# Patient Record
Sex: Male | Born: 1990 | Race: White | Hispanic: No | Marital: Single | Smoking: Current every day smoker
Health system: Southern US, Community
[De-identification: ages and names within clinical notes are randomized; demographics above are authoritative.]

## PROBLEM LIST (undated history)

## (undated) DIAGNOSIS — Z720 Tobacco use: Secondary | ICD-10-CM

## (undated) DIAGNOSIS — I309 Acute pericarditis, unspecified: Secondary | ICD-10-CM

## (undated) HISTORY — PX: OTHER SURGICAL HISTORY: SHX169

## (undated) HISTORY — PX: TONSILLECTOMY: SUR1361

## (undated) HISTORY — PX: EXTERNAL EAR SURGERY: SHX627

---
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
## 2010-09-19 NOTE — Letter (Signed)
Summary: [facility] Referral No Show  [facility]  [address]  [place], [place] [address]   Phone: [phone]  Fax: [phone]     12/16/09  [facility] Dr. Karleen Hampshire [address] [place], [place]  [address] Ph [phone]/MWU [phone]   Re:    Derrick Woods Clinkenbeard DOB:    [birth year]    The above named patient was a no show for the scheduled appointment:  Date:  12/12/09 Time: 3:00pm  Thank you for your kind referral.  Please feel free to contact our office if we can be of further service.  Sincerely,   [facility] Office of Dr. Terrance Mass, MD

## 2010-10-10 IMAGING — CR DG FINGER MIDDLE 2+V*R*
1 series · 1 of 1 positions shown · non-contrast
Comparison: 05/28/2009.

CLINICAL DATA: Slammed finger in door 4 days ago.

RIGHT MIDDLE FINGER 2+V

[view not recorded]
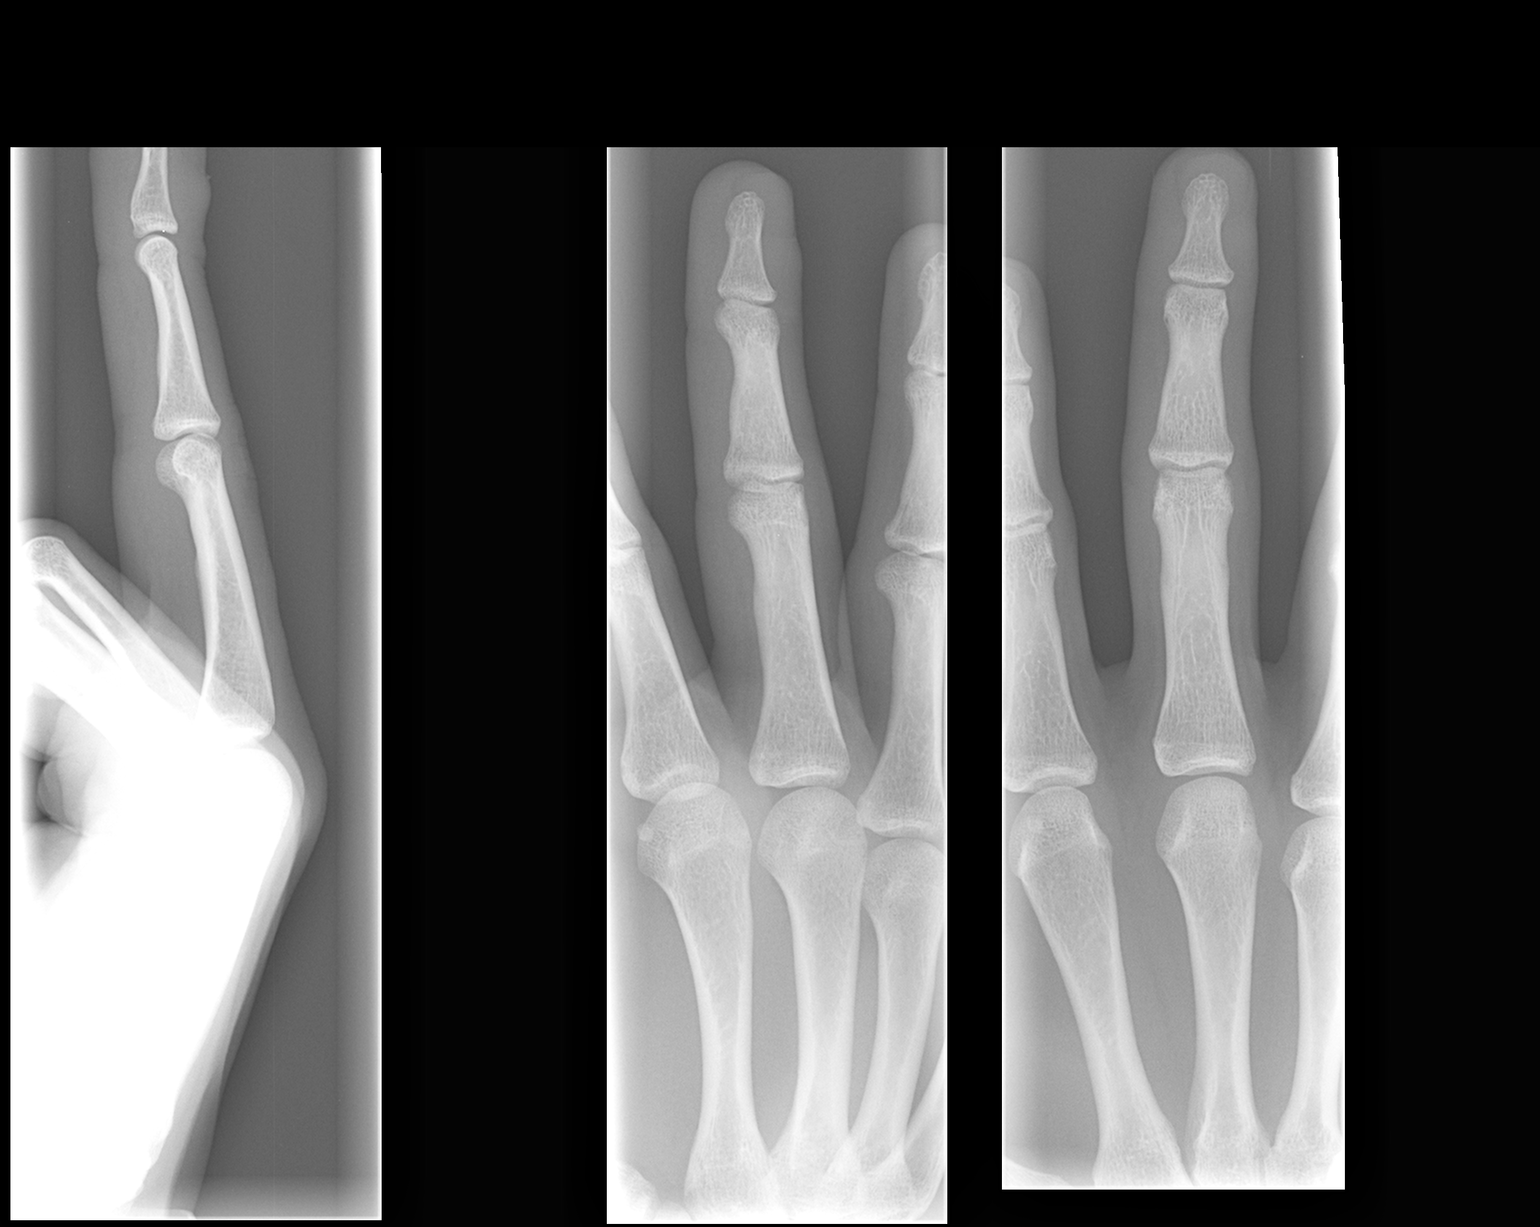

[1 of 1 positions shown; findings below may reference images not displayed]

FINDINGS: There are no fractures or dislocations.  There are no
radiopaque foreign bodies.
IMPRESSION: No acute bony injury.

## 2010-10-11 IMAGING — CR DG SHOULDER 2+V*L*
3 series · 3 of 3 positions shown · non-contrast
Comparison: None.

CLINICAL DATA: Left upper arm lacerations.  Now with delayed
swelling.

LEFT SHOULDER - 2+ VIEW

[view not recorded (1 of 3)]
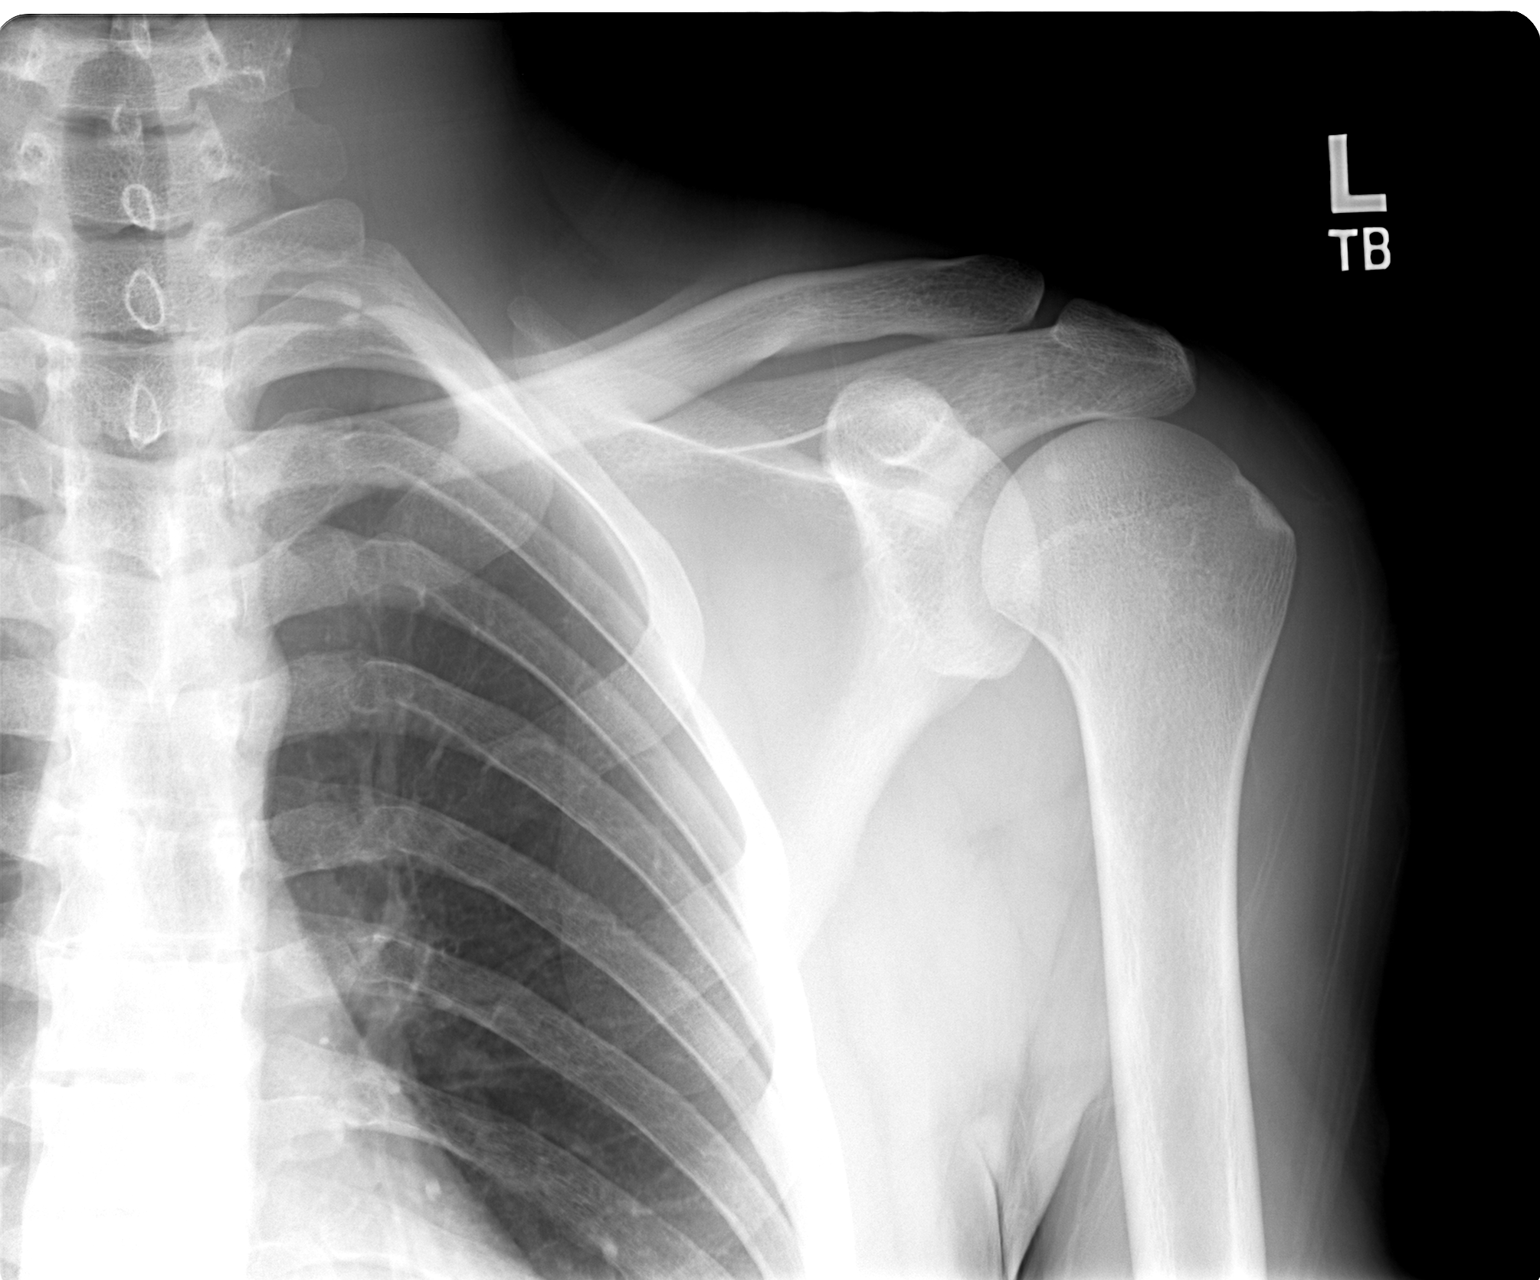

[view not recorded (2 of 3)]
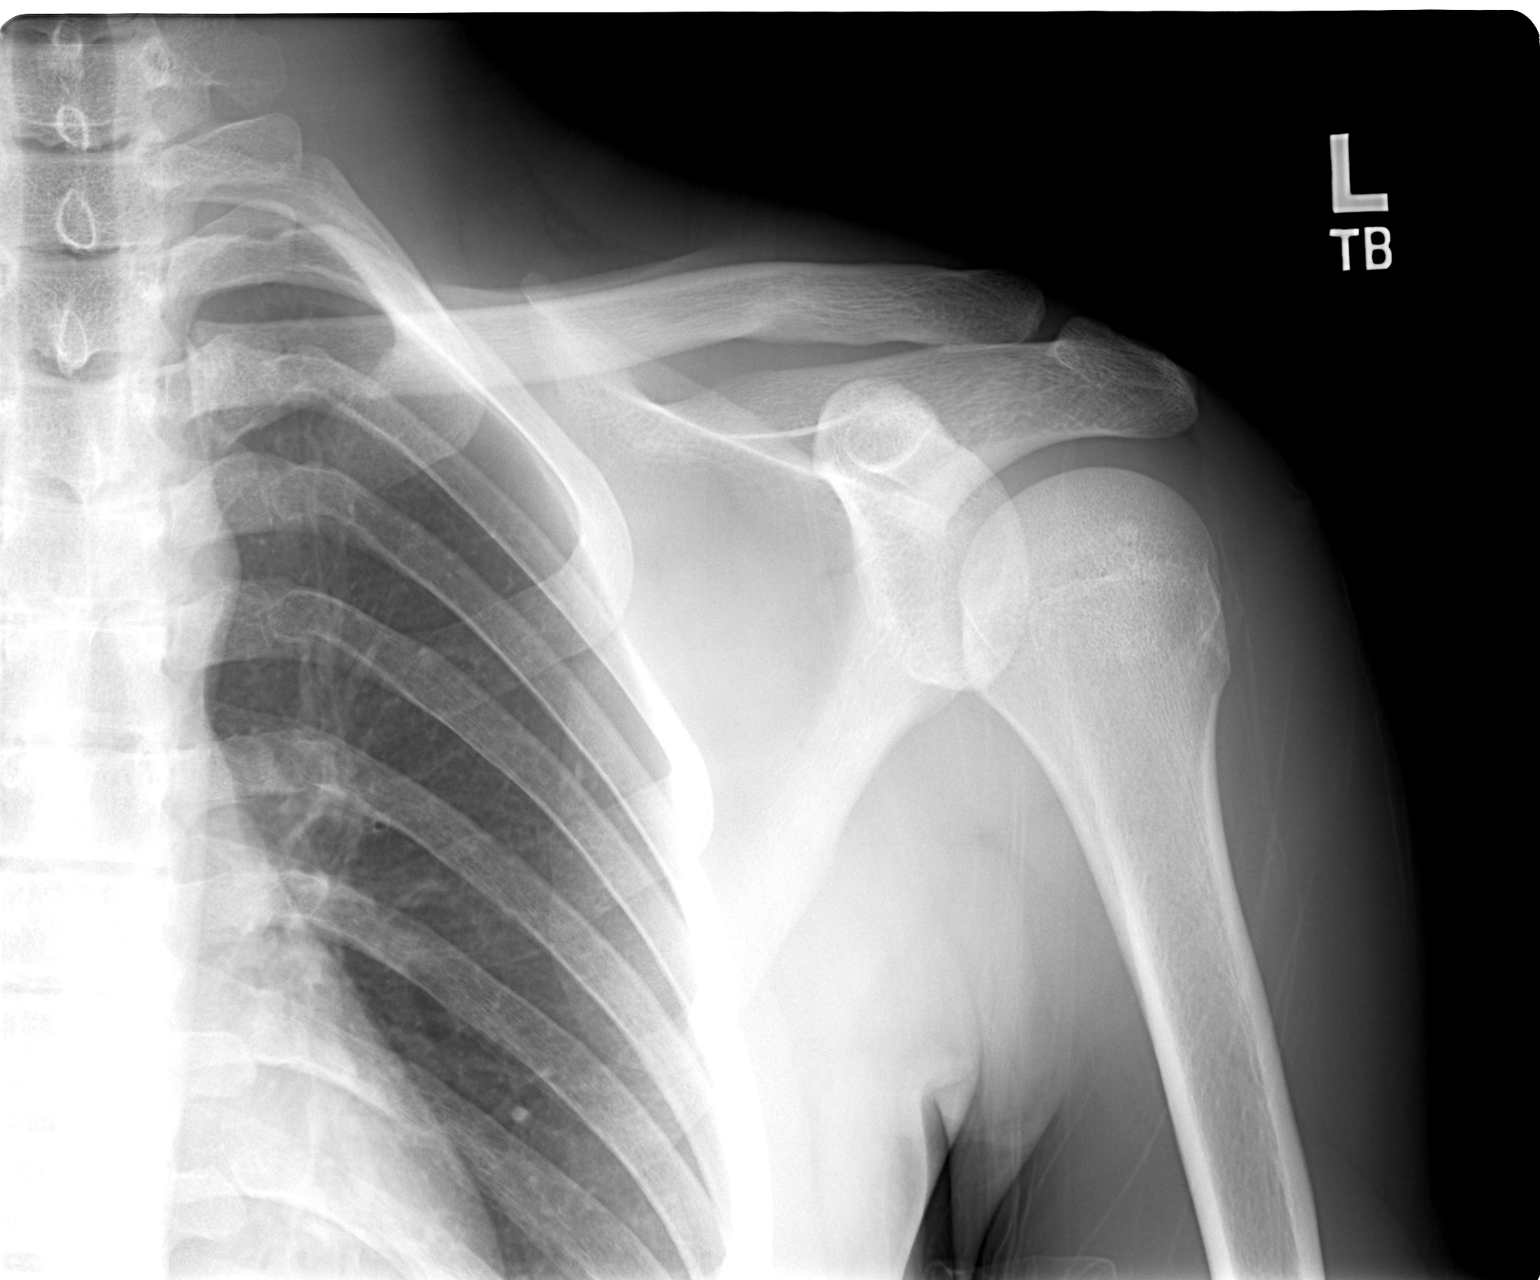

[view not recorded (3 of 3)]
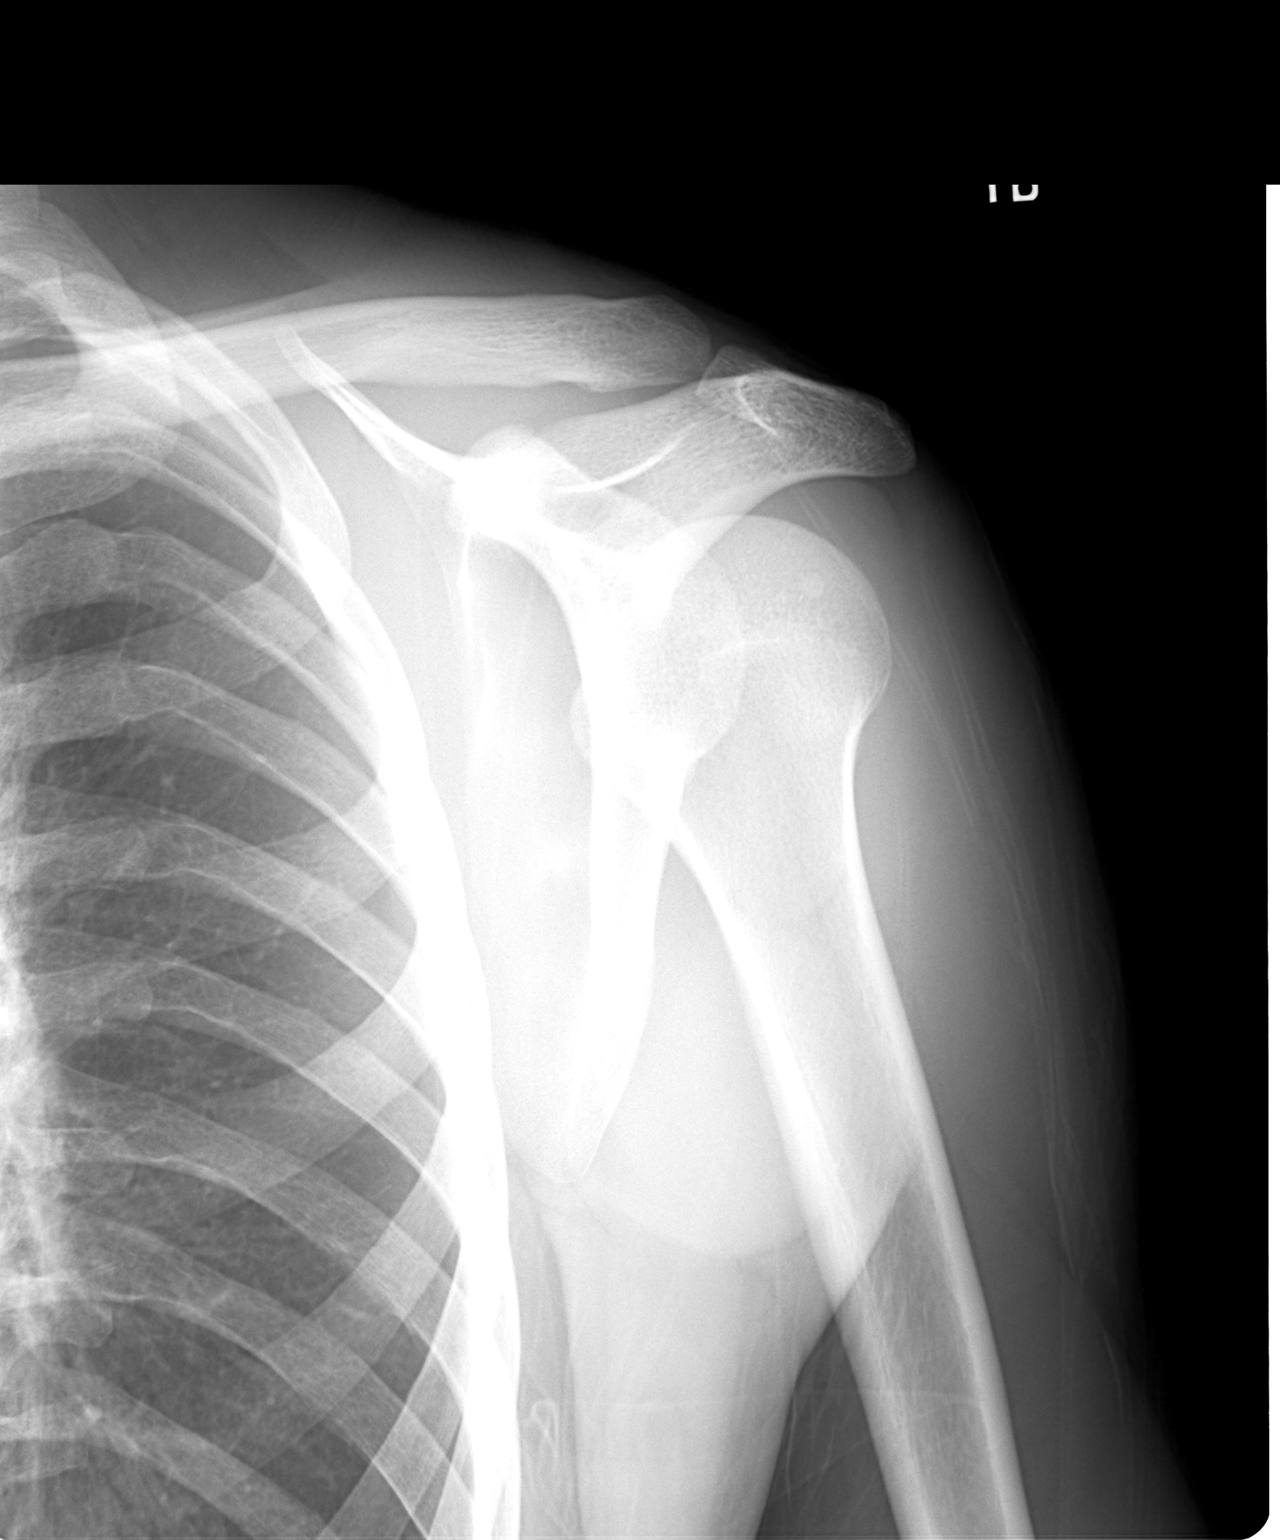

[3 of 3 positions shown; findings below may reference images not displayed]

FINDINGS: There is no evidence of fracture or dislocation.  There
is no evidence of arthropathy or other focal bone abnormality.
Soft tissues are unremarkable.
IMPRESSION: Negative.

## 2010-11-05 LAB — BASIC METABOLIC PANEL WITH GFR
CO2: 30 meq/L (ref 19–32)
Calcium: 9.2 mg/dL (ref 8.4–10.5)
Chloride: 104 meq/L (ref 96–112)
Creatinine, Ser: 1.11 mg/dL (ref 0.4–1.5)
GFR calc Af Amer: >60 mL/min (ref >60)
GFR calc non Af Amer: >60 mL/min (ref >60)
Glucose, Bld: 103 mg/dL — ABNORMAL HIGH (ref 70–99)
Sodium: 140 meq/L (ref 135–145)

## 2010-11-05 LAB — DIFFERENTIAL
Basophils Absolute: 0 10*3/uL (ref 0.0–0.1)
Basophils Relative: 0 % (ref 0–1)
Eosinophils Absolute: 0 10*3/uL (ref 0.0–0.7)
Eosinophils Relative: 0 % (ref 0–5)
Lymphocytes Relative: 15 % (ref 12–46)
Lymphs Abs: 1.3 K/uL (ref 0.7–4.0)
Monocytes Absolute: 0.8 10*3/uL (ref 0.1–1.0)
Monocytes Relative: 9 % (ref 3–12)
Neutro Abs: 6.5 10*3/uL (ref 1.7–7.7)
Neutrophils Relative %: 75 % (ref 43–77)

## 2010-11-05 LAB — CBC
HCT: 39 % (ref 39.0–52.0)
Hemoglobin: 13.3 g/dL (ref 13.0–17.0)
MCHC: 34 g/dL (ref 30.0–36.0)
MCV: 89.5 fL (ref 78.0–100.0)
Platelets: 218 K/uL (ref 150–400)
RBC: 4.35 MIL/uL (ref 4.22–5.81)
RDW: 12.6 % (ref 11.5–15.5)
WBC: 8.6 K/uL (ref 4.0–10.5)

## 2010-11-05 LAB — BASIC METABOLIC PANEL
BUN: 16 mg/dL (ref 6–23)
Potassium: 3.5 mEq/L (ref 3.5–5.1)

## 2010-11-27 LAB — BASIC METABOLIC PANEL
Calcium: 9.4 mg/dL (ref 8.4–10.5)
Chloride: 103 mEq/L (ref 96–112)
Creatinine, Ser: 1.09 mg/dL (ref 0.4–1.5)
Glucose, Bld: 108 mg/dL — ABNORMAL HIGH (ref 70–99)
Potassium: 4.1 mEq/L (ref 3.5–5.1)

## 2011-01-05 NOTE — Op Note (Signed)
[place]  Patient:    Derrick Woods, Derrick Woods                        MRN: [id number] Proc. Date: 11/07/00 Attending:  Margit Banda. Jearld Fenton, M.D. CC:         Dr. Sudie Bailey, Sidney Ace   Operative Report  PREOPERATIVE DIAGNOSIS:  Chronic serous otitis media.  POSTOPERATIVE DIAGNOSIS:  Chronic serous otitis media.  OPERATION PERFORMED:  Removal of right tympanostomy tube with another myringotomy tube and a left myringotomy tube.  SURGEON:  Margit Banda. Jearld Fenton, M.D.  ANESTHESIA:  General mask ventilation.  ESTIMATED BLOOD LOSS:  Less than 1 cc.  INDICATIONS FOR PROCEDURE:  The patient is a 20-year-old who has had previous tympanostomy tubes.  The left one has extruded and he has recollected a middle ear effusion.  He has already has his adenoids removed.  He has decreased hearing on audiogram.  The parents were informed ofthe risks and benefits of the procedure including bleeding, infection, perforation, chronic drainage, hearing loss and risks of the anesthetic.  All questions were answered and consent was obtained.  DESCRIPTION OF PROCEDURE:  The patient was taken to the operating room and placed in supine position.  After adequate general mask ventilation anesthesia, he was placed in the left gaze position.  The right tympanostomy tube was removed.  There was granulation tissue around the tympanic membrane perforation that was removed with the suction.  A Sheehy tube was placed without difficulty.  Floxin drops were instilled.  The left ear was repeated in a similar fashion except a myringotomy made in the anterior inferior quadrant.  Serous effusion was suctioned from the middle ear. Sheehy tube placed.  Floxin drops were instilled.  The patient was awakened and brought to the recovery room in stable condition.  Counts correct.. DD:  11/07/00 TD:  11/07/00 Job: [id number]

## 2011-01-05 NOTE — Op Note (Signed)
NAME:  Derrick Woods, Derrick Woods                         ACCOUNT NO.:  [id number]   MEDICAL RECORD NO.:  [id number]                   PATIENT TYPE:  AMB   LOCATION:  DSC                                  FACILITY:  [facility]   PHYSICIAN:  Suzanna Obey, M.D.                    DATE OF BIRTH:  09-24-1990   DATE OF PROCEDURE:  06/17/2003  DATE OF DISCHARGE:                                 OPERATIVE REPORT   PREOPERATIVE DIAGNOSES:  1. Eustachian tube dysfunction.  2. Left middle ear and mastoid cholesteatoma.   PROCEDURES:  1. Right T-tube placement.  2. Left tympanomastoidectomy with canal wall up with facial nerve monitor.   ANESTHESIA:  General endotracheal.   ESTIMATED BLOOD LOSS:  Approximately less than 25 mL.   INDICATIONS FOR PROCEDURE:  This is a 20 year old who has had drainage from  his left ear which has persisted and he has epithelial debris in the attic  location.  He has had previous tympanostomy tubes.  The right one is  occluded with a middle effusion.  There is also retraction.  There is severe  retraction in the attic on the left side.  Cholesteatoma was seen on CT scan  but the ossicular chain still looked intact.  The mother was informed of  risks and benefits of the procedure including bleeding, infection, facial  nerve paralysis, hearing loss, vertigo, recurrence of the cholesteatoma,  taste alteration, scar, and risk of the anesthetic.  All questions were  answered and consent was obtained.   DESCRIPTION OF PROCEDURE:  The patient was taken to the operating room,  placed in the supine position.  After adequate general endotracheal tube  anesthesia, was placed in the left gaze position and cerumen was cleaned  from the external auditory canal under otomicroscope direction.  The  tympanostomy tube which was occluded was removed which was a Sheehy tube.  There was a fairly large perforation.  The T-tube was then placed without  difficulty.  There was some thick mucous  material in the middle ear.  There  was retraction in the posterior/superior quadrant up against the  incudostapedial joint.   The left ear was then begun.  The canal and posterior auricular area was  injected with 1% lidocaine with 1:100,000 epinephrine.  The facial nerve  monitor probes were placed and the machine was calibrated with low  impedance.  The patient was prepped and draped in the usual sterile manner.  Examination of the ear under otomicroscope direction revealed significant  retraction in the attic with a deep retraction pocket.  It appeared to go on  the medial aspect of the malleus head.  A tympanomeatal flap was created  with a 6 and 12 o'clock incision and a beaver blade flap and the middle ear  was entered.  There was thick mucus in the middle ear.  There was thick  mucosa across the  incudostapedial joint which was teased off.  The  incudostapedial joint was intact.  There was obvious cholesteatoma on top of  the incus.  There was erosion of the posterior superior canal wall.  The  postauricular incision was made and the ear was elevated anteriorly.   A fascial graft was harvested from the temporalis muscle fascia.  Macewan's  triangle was well defined.  The mastoid on the CT scan had shown almost no  pneumatization of the mastoid.  A mastoidectomy was begun with carrying it  directly over Macewan's triangle.  Care was taken to dissect right along the  tegmen and thin in the canal wall.  The antrum was identified and  immediately a large cholesteatoma was encountered.  The attic area was  opened and the canal wall was thinned and the tegmen was followed.  The  cholesteatoma was teased out around the fosses incudis and off the lateral  semicircular canal.  All the bone was intact.  Once dissection was carried  to the incus it could clearly be seen that the cholesteatoma went medial to  the incus.  Also on examination, the cholesteatoma and epithelial debris was  medial  to the malleus head as well.  Because of this, the incudostapedial  joint was divided and the incus was removed.  The head of the malleus was  nipped with the malleus nippers.  The cholesteatoma was then teased out of  the attic region carefully making sure to get all epithelial debris.  Everything was irrigated.   The whirlybird was used to make sure the underneath of the bridge was  removed of all epithelial debris.  The stapes was intact and did not appear  to have any erosion.  The facial nerve was not identified as it was deep and  did appear to have a bony covering over it and the facial nerve monitor  never discharged.  Because of the large attic retraction eroded area, some  Gelfoam soaked with Ciprodex was placed into this area and then a piece of  cartilage was harvested from the postauricular aspect of the ear and  positioned into the attic region.  The fascial graft was then placed over  this and tucked underneath the tympanic membrane and brought up onto the  canal wall.  The tube that was in the ear was removed and the T-tube was  placed.  The tympanomeatal flap was then laid back down over the canal wall  and positioned.  The mastoid was partially filled with Ciprodex Gelfoam.  This was very sclerotic, also no mastoid cells, except for the antrum.  The  lateral portion of the tympanic membrane was filled with Gelfoam soaked with  Ciprodex.  The tube was left open so it could drain.   The postauricular incision was closed  closing the periosteum of the mastoid  flap with interrupted 4-0 chromic and subcutaneous with interrupted 4-0  chromic.  The skin was closed with Benzoin and Steri-Strips.  A mastoid  dressing was placed.   The patient was awakened and brought to the recovery room in stable  condition.  Counts correct.                                               Suzanna Obey, M.D.   Cordelia Pen  D:  06/17/2003  T:  06/17/2003  Job:  [id number]  cc:   Patrica Duel,  M.D.  [address]  [place]  [place] [address]  Fax: [phone]

## 2011-12-31 ENCOUNTER — Emergency Department (HOSPITAL_COMMUNITY)
Admission: EM | Admit: 2011-12-31 | Discharge: 2012-01-01 | Disposition: A | Discharge status: Home / Self Care | Payer: Self-pay | Attending: Emergency Medicine | Admitting: Emergency Medicine

## 2011-12-31 ENCOUNTER — Encounter (HOSPITAL_COMMUNITY): Payer: Self-pay

## 2011-12-31 DIAGNOSIS — H5710 Ocular pain, unspecified eye: Secondary | ICD-10-CM | POA: Insufficient documentation

## 2011-12-31 DIAGNOSIS — H2000 Unspecified acute and subacute iridocyclitis: Secondary | ICD-10-CM | POA: Insufficient documentation

## 2011-12-31 NOTE — ED Notes (Signed)
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 Having burning in both eyes per pt. Watched them for about 2 hours off and on per pt.

## 2012-01-01 MED ORDER — HOMATROPINE HBR 5 % OP SOLN
2.0000 [drp] | Freq: Once | OPHTHALMIC | Status: DC
  Filled 2012-01-01: qty 5

## 2012-01-01 MED ORDER — HYDROCODONE-ACETAMINOPHEN 5-325 MG PO TABS
1.0000 | ORAL_TABLET | Freq: Once | ORAL | Status: AC
  Administered 2012-01-01: 1 via ORAL
  Filled 2012-01-01: qty 1

## 2012-01-01 MED ORDER — HYDROCODONE-ACETAMINOPHEN 5-325 MG PO TABS: ORAL_TABLET | ORAL | Status: DC

## 2012-01-01 MED ORDER — TROPICAMIDE 1 % OP SOLN
2.0000 [drp] | Freq: Once | OPHTHALMIC | Status: AC
  Administered 2012-01-01: 2 [drp] via OPHTHALMIC
  Filled 2012-01-01: qty 3

## 2012-01-01 MED ORDER — TROPICAMIDE 1 % OP SOLN: OPHTHALMIC | Status: DC

## 2012-01-01 MED ORDER — IBUPROFEN 800 MG PO TABS
800.0000 mg | ORAL_TABLET | Freq: Once | ORAL | Status: AC
  Administered 2012-01-01: 800 mg via ORAL
  Filled 2012-01-01: qty 1

## 2012-01-01 NOTE — Discharge Instructions (Signed)
Iritis Iritis is an inflammation of the colored part of the eye (iris). Other parts at the front of the eye may also be inflamed. The iris is part of the middle layer of the eyeball which is called the uvea or the uveal track. Any part of the uveal track can become inflamed. The other portions of the uveal track are the choroid (the thin membrane under the outer layer of the eye), and the ciliary body (joins the choroid and the iris and produces the fluid in the front of the eye).  It is extremely important to treat iritis early, as it may lead to internal eye damage causing scarring or diseases such as glaucoma. Some people have only one attack of iritis (in one or both eyes) in their lifetime, while others may get it many times. CAUSES Iritis can be associated with many different diseases, but mostly occurs in otherwise healthy people. Examples of diseases that can be associated with iritis include:  Diseases where the body's immune system attacks tissues within your own body (autoimmune diseases).   Infections (tuberculosis, gonorrhea, fungus infections, Lyme disease, infection of the lining of the heart).   Trauma or injury.   Eye diseases (acute glaucoma and others).   Inflammation from other parts of the uveal track.   Severe eye infections.   Other rare diseases.  SYMPTOMS  Eye pain or aching.   Sensitivity to light.   Loss of sight or blurred vision.   Redness of the eye. This is often accompanied by a ring of redness around the outside of the cornea, or clear covering at the front of the eye (ciliary flush).   Excessive tearing of the eye(s).   A small pupil that does not enlarge in the dark and stays smaller than the other eye's pupil.   A whitish area that obscures the lower part of the colored circular iris. Sometimes this is visible when looking at the eye, where the whitish area has a "fluid level" or flat top. This is called a "hypopyon" and is actually pus inside the  eye.  Since iritis causes the eye to become red, it is often confused with a much less dangerous form of "pink eye" or conjunctivitis. One of the most important symptoms is sensitivity to light. Anytime there is redness, discomfort in the eye(s) and extreme light sensitivity, it is extremely important to see an ophthalmologist as soon as possible. TREATMENT Acute iritis requires prompt medical evaluation by an eye specialist (ophthalmologist.) Treatment depends on the underlying cause but may include:  Corticosteroid eye drops and dilating eye drops. Follow your caregiver's exact instructions on taking and stopping corticosteroid medications (drops or pills).   Occasionally, the iritis will be so severe that it will not respond to commonly used medications. If this happens, it may be necessary to use steroid injections. The injections are given under the eye's outer surface. Sometimes oral medications are given. The decision on treatment used for iritis is usually made on an individual basis.  HOME CARE INSTRUCTIONS Your care giver will give specific instructions regarding the use of eye medications or other medications. Be certain to follow all instructions in both taking and stopping the medications. SEEK IMMEDIATE MEDICAL CARE IF:  You have redness of one or both eye.   You experience a great deal of light sensitivity.   You have pain or aching in either eye.  MAKE SURE YOU:   Understand these instructions.   Will watch your condition.  Will get help right away if you are not doing well or get worse.  Document Released: 08/06/2005 Document Revised: 07/26/2011 Document Reviewed: 01/24/2007 [facility] Patient Information 2012 [place], [place].   Take the pain medicine as directed.  Take ibuprofen 800 mg every 8 hrs with food.  Insert 2 drops of homatropine in both eyes every8 hrs for the next 1-2 days.  Follow up with dr. Lita Mains.

## 2012-01-01 NOTE — ED Provider Notes (Signed)
History     CSN: [id number]  Arrival date & time 12/31/11  2347   First MD Initiated Contact with Patient 01/01/12 [phone]      Chief Complaint  Patient presents with  . 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  States he was not looking directly at the light but was looking in the general direction.  B eyes now hurting.  Patient is a 21 y.o. male presenting with eye injury. The history is provided by the patient. No language interpreter was used.  Eye Injury This is a new problem. The current episode started today. Pertinent negatives include no headaches.    History reviewed. No pertinent past medical history.  History reviewed. No pertinent past surgical history.  History reviewed. No pertinent family history.  History  Substance Use Topics  . Smoking status: Never Smoker   . Smokeless tobacco: Not on file  . Alcohol Use: No      Review of Systems  Eyes: Positive for pain. Negative for discharge, redness and visual disturbance.  Neurological: Negative for headaches.  All other systems reviewed and are negative.    Allergies  Review of patient's allergies indicates no known allergies.  Home Medications   Current Outpatient Rx  Name Route Sig Dispense Refill  . HYDROCODONE-ACETAMINOPHEN 5-325 MG PO TABS  One tab po q 4-6 hrs prn pain 20 tablet 0    BP 107/67  Pulse 81  Temp(Src) 98 F (36.7 C) (Oral)  Resp 20  Ht 5\' 10"  (1.778 m)  Wt 150 lb (68.04 kg)  BMI 21.52 kg/m2  SpO2 100%  Physical Exam  Nursing note and vitals reviewed. Constitutional: He is oriented to person, place, and time. He appears well-developed and well-nourished.  HENT:  Head: Normocephalic and atraumatic.  Eyes: Conjunctivae, EOM and lids are normal. Pupils are equal, round, and reactive to light. Right eye exhibits no discharge and no exudate.  No foreign body present in the right eye. Left eye exhibits no discharge and no exudate. No foreign body present in the left eye. Right conjunctiva is not injected. Right conjunctiva has no hemorrhage. Left conjunctiva is not injected. Left conjunctiva has no hemorrhage. No scleral icterus.       B eyes very light sensitive.  Neck: Normal range of motion.  Cardiovascular: Normal rate, regular rhythm, normal heart sounds and intact distal pulses.   Pulmonary/Chest: Effort normal and breath sounds normal. No respiratory distress.  Abdominal: Soft. He exhibits no distension. There is no tenderness.  Musculoskeletal: Normal range of motion.  Neurological: He is alert and oriented to person, place, and time.  Skin: Skin is warm and dry.  Psychiatric: He has a normal mood and affect. Judgment normal.    ED Course  Procedures (including critical care time)  Labs Reviewed - No data to display No results found.   1. Acute iritis of both eyes       MDM  Avoid bright light for 1-2 day rx homatropine drops 2 drops every 8 hrs x 2 days. rx hydrocodone , 20 OTC ibuprofen 800 mg TID F/u with dr. Janit Bern, PA 01/01/12 [phone]

## 2012-01-02 NOTE — ED Provider Notes (Signed)
Medical screening examination/treatment/procedure(s) were performed by non-physician practitioner and as supervising physician I was immediately available for consultation/collaboration.  Wyoma Genson S. Rebeccah Ivins, MD 01/02/12 0727 

## 2013-03-27 ENCOUNTER — Emergency Department (HOSPITAL_COMMUNITY)
Admission: EM | Admit: 2013-03-27 | Discharge: 2013-03-27 | Disposition: A | Discharge status: Home / Self Care | Payer: Self-pay | Attending: Emergency Medicine | Admitting: Emergency Medicine

## 2013-03-27 ENCOUNTER — Encounter (HOSPITAL_COMMUNITY): Payer: Self-pay | Admitting: *Deleted

## 2013-03-27 DIAGNOSIS — R079 Chest pain, unspecified: Secondary | ICD-10-CM | POA: Insufficient documentation

## 2013-03-27 DIAGNOSIS — J029 Acute pharyngitis, unspecified: Secondary | ICD-10-CM | POA: Insufficient documentation

## 2013-03-27 DIAGNOSIS — R059 Cough, unspecified: Secondary | ICD-10-CM | POA: Insufficient documentation

## 2013-03-27 DIAGNOSIS — F17200 Nicotine dependence, unspecified, uncomplicated: Secondary | ICD-10-CM | POA: Insufficient documentation

## 2013-03-27 DIAGNOSIS — J3489 Other specified disorders of nose and nasal sinuses: Secondary | ICD-10-CM | POA: Insufficient documentation

## 2013-03-27 DIAGNOSIS — R6883 Chills (without fever): Secondary | ICD-10-CM | POA: Insufficient documentation

## 2013-03-27 DIAGNOSIS — J069 Acute upper respiratory infection, unspecified: Secondary | ICD-10-CM | POA: Insufficient documentation

## 2013-03-27 DIAGNOSIS — R05 Cough: Secondary | ICD-10-CM | POA: Insufficient documentation

## 2013-03-27 MED ORDER — BENZONATATE 100 MG PO CAPS: 200.0000 mg | ORAL_CAPSULE | Freq: Three times a day (TID) | ORAL | Status: DC | PRN

## 2013-03-27 MED ORDER — PREDNISONE 10 MG PO TABS: ORAL_TABLET | ORAL | Status: DC

## 2013-03-27 NOTE — ED Notes (Signed)
Sore throat ,cough,chest hurts.for 2 weeks.

## 2013-03-27 NOTE — ED Provider Notes (Signed)
CSN: [id number]     Arrival date & time 03/27/13  1759 History     First MD Initiated Contact with Patient 03/27/13 1818     Chief Complaint  Patient presents with  . Sore Throat   (Consider location/radiation/quality/duration/timing/severity/associated sxs/prior Treatment) Patient is a 22 y.o. male presenting with pharyngitis. The history is provided by the patient.  Sore Throat This is a new problem. The current episode started 1 to 4 weeks ago. The problem occurs constantly. The problem has been unchanged. Associated symptoms include chest pain, chills, congestion, coughing and a sore throat. Pertinent negatives include no abdominal pain, anorexia, fever, headaches, joint swelling, myalgias, nausea, neck pain, numbness, rash, swollen glands, urinary symptoms, visual change, vomiting or weakness. Associated symptoms comments:  C/o sharp pain to left upper chest with movement.  Lifts heavy objects at work.  No wheezing or shortness of breath. Nothing aggravates the symptoms. He has tried nothing for the symptoms. The treatment provided no relief.    History reviewed. No pertinent past medical history. Past Surgical History  Procedure Laterality Date  . External ear surgery    . Tonsillectomy    . Tubes in ears     History reviewed. No pertinent family history. History  Substance Use Topics  . Smoking status: Current Every Day Smoker  . Smokeless tobacco: Not on file  . Alcohol Use: No    Review of Systems  Constitutional: Positive for chills. Negative for fever, activity change and appetite change.  HENT: Positive for congestion and sore throat. Negative for ear pain, mouth sores, trouble swallowing, neck pain, neck stiffness and voice change.   Respiratory: Positive for cough. Negative for chest tightness, shortness of breath, wheezing and stridor.   Cardiovascular: Positive for chest pain.  Gastrointestinal: Negative for nausea, vomiting, abdominal pain and anorexia.    Genitourinary: Negative for hematuria and flank pain.  Musculoskeletal: Negative for myalgias and joint swelling.  Skin: Negative for rash.  Neurological: Negative for dizziness, syncope, weakness, numbness and headaches.  Hematological: Negative for adenopathy.  All other systems reviewed and are negative.    Allergies  Review of patient's allergies indicates no known allergies.  Home Medications   Current Outpatient Rx  Name  Route  Sig  Dispense  Refill  . benzonatate (TESSALON) 100 MG capsule   Oral   Take 2 capsules (200 mg total) by mouth 3 (three) times daily as needed for cough.   21 capsule   0   . predniSONE (DELTASONE) 10 MG tablet      Take 6 tablets day one, 5 tablets day two, 4 tablets day three, 3 tablets day four, 2 tablets day five, then 1 tablet day six   21 tablet   0    BP 116/66  Pulse 91  Temp(Src) 98.2 F (36.8 C) (Oral)  Resp 20  Ht 5\' 10"  (1.778 m)  Wt 155 lb (70.308 kg)  BMI 22.24 kg/m2  SpO2 99% Physical Exam  Nursing note and vitals reviewed. Constitutional: He is oriented to person, place, and time. He appears well-developed and well-nourished. No distress.  HENT:  Head: Normocephalic and atraumatic. No trismus in the jaw.  Right Ear: Tympanic membrane and ear canal normal.  Left Ear: Tympanic membrane and ear canal normal.  Nose: Rhinorrhea present. No mucosal edema.  Mouth/Throat: Uvula is midline and mucous membranes are normal. No edematous. Posterior oropharyngeal edema and posterior oropharyngeal erythema present. No oropharyngeal exudate or tonsillar abscesses.  Mild erythema of the oropharynx  and lengthening of the uvula.  No oral lesions , edema or exudates.  Airway is patent.  No PTA.    Eyes: EOM are normal. Pupils are equal, round, and reactive to light.  Neck: Normal range of motion. Neck supple.  Cardiovascular: Normal rate, regular rhythm, normal heart sounds and intact distal pulses.   No murmur heard. Pulmonary/Chest:  Effort normal and breath sounds normal. No respiratory distress. He has no wheezes. He has no rales. He exhibits tenderness. He exhibits no bony tenderness, no crepitus and no retraction.    Localized ttp of the left chest wall and anterior left shoulder.  Pain is reproduced with arm movement.  No joint pain or cervical tenderness.  Pt has full ROM of the left arm.  Distal sensation intact, radial pulse brisk, grip strength strong and equal bilaterally  Musculoskeletal: Normal range of motion. He exhibits no edema.  Lymphadenopathy:    He has no cervical adenopathy.  Neurological: He is alert and oriented to person, place, and time. He exhibits normal muscle tone. Coordination normal.  Skin: Skin is warm and dry.    ED Course   Procedures (including critical care time)  Labs Reviewed - No data to display No results found. 1. Acute pharyngitis   2. URI (upper respiratory infection)     MDM     VSS.  Pt is well appearing, PERC negative.   Chest pain likely musculoskeletal.  Has mild lengthening of the uvula w/o edema.  Airway patent.  Appears stable for discharge.  Agrees to return here for any worsening symptoms  Aneliz Carbary L. Trisha Mangle, PA-C 03/27/13 2344

## 2013-03-28 NOTE — ED Provider Notes (Signed)
History/physical exam/procedure(s) were performed by non-physician practitioner and as supervising physician I was immediately available for consultation/collaboration. I have reviewed all notes and am in agreement with care and plan.   Millee Denise S Pavneet Markwood, MD 03/28/13 2000 

## 2015-09-17 ENCOUNTER — Emergency Department (HOSPITAL_COMMUNITY)
Admission: EM | Admit: 2015-09-17 | Discharge: 2015-09-17 | Disposition: A | Discharge status: Home / Self Care | Payer: BLUE CROSS/BLUE SHIELD | Attending: Emergency Medicine | Admitting: Emergency Medicine

## 2015-09-17 ENCOUNTER — Encounter (HOSPITAL_COMMUNITY): Payer: Self-pay | Admitting: *Deleted

## 2015-09-17 DIAGNOSIS — F17210 Nicotine dependence, cigarettes, uncomplicated: Secondary | ICD-10-CM | POA: Insufficient documentation

## 2015-09-17 DIAGNOSIS — M545 Low back pain: Secondary | ICD-10-CM | POA: Insufficient documentation

## 2015-09-17 DIAGNOSIS — R52 Pain, unspecified: Secondary | ICD-10-CM | POA: Diagnosis present

## 2015-09-17 DIAGNOSIS — B349 Viral infection, unspecified: Secondary | ICD-10-CM | POA: Diagnosis not present

## 2015-09-17 DIAGNOSIS — M546 Pain in thoracic spine: Secondary | ICD-10-CM | POA: Insufficient documentation

## 2015-09-17 MED ORDER — BENZONATATE 100 MG PO CAPS: 200.0000 mg | ORAL_CAPSULE | Freq: Three times a day (TID) | ORAL | Status: DC | PRN

## 2015-09-17 MED ORDER — IBUPROFEN 800 MG PO TABS: 800.0000 mg | ORAL_TABLET | Freq: Three times a day (TID) | ORAL | Status: DC

## 2015-09-17 MED ORDER — OSELTAMIVIR PHOSPHATE 75 MG PO CAPS: 75.0000 mg | ORAL_CAPSULE | Freq: Two times a day (BID) | ORAL | Status: DC

## 2015-09-17 NOTE — ED Provider Notes (Signed)
CSN: [id number]     Arrival date & time 09/17/15  4098 History   First MD Initiated Contact with Patient 09/17/15 [phone]     Chief Complaint  Patient presents with  . Generalized Body Aches     (Consider location/radiation/quality/duration/timing/severity/associated sxs/prior Treatment) HPI  Derrick Woods is a 25 y.o. male who presents to the Emergency Department complaining of sudden onset of generalized body aches, fever, chills, cough and nasal congestion.  He states that one of his co-workers has been recently diagnosed with the flu and he believes he has the same.  He reports having aching pain to most of his body especially his upper and lower back.  He has tried Dayquil with out relief.  Symptoms began yesterday.  He denies abdominal pain, dysuria, shortness of breath, nausea vomiting and chest pain.  Nothing makes his symptoms better or worse.   History reviewed. No pertinent past medical history. Past Surgical History  Procedure Laterality Date  . External ear surgery    . Tonsillectomy    . Tubes in ears     No family history on file. Social History  Substance Use Topics  . Smoking status: Current Every Day Smoker    Types: Cigarettes  . Smokeless tobacco: None  . Alcohol Use: No    Review of Systems  Constitutional: Positive for fever, chills and fatigue. Negative for activity change and appetite change.  HENT: Positive for congestion and rhinorrhea. Negative for facial swelling, sore throat and trouble swallowing.   Eyes: Negative for visual disturbance.  Respiratory: Positive for cough. Negative for shortness of breath, wheezing and stridor.   Gastrointestinal: Negative for nausea, vomiting and abdominal pain.  Genitourinary: Negative for dysuria, flank pain and decreased urine volume.  Musculoskeletal: Positive for myalgias and back pain. Negative for neck pain and neck stiffness.  Skin: Negative.  Negative for rash.  Neurological: Negative for dizziness,  weakness, numbness and headaches.  Hematological: Negative for adenopathy.  Psychiatric/Behavioral: Negative for confusion.  All other systems reviewed and are negative.     Allergies  Review of patient's allergies indicates no known allergies.  Home Medications   Prior to Admission medications   Medication Sig Start Date End Date Taking? Authorizing Provider  benzonatate (TESSALON) 100 MG capsule Take 2 capsules (200 mg total) by mouth 3 (three) times daily as needed for cough. 03/27/13   Ailen Strauch, PA-C  predniSONE (DELTASONE) 10 MG tablet Take 6 tablets day one, 5 tablets day two, 4 tablets day three, 3 tablets day four, 2 tablets day five, then 1 tablet day six 03/27/13   Marquerite Forsman, PA-C   BP 133/76 mmHg  Pulse 92  Temp(Src) 98.9 F (37.2 C) (Oral)  Resp 16  Ht  (1.778 m)  Wt 72.576 kg  BMI 22.96 kg/m2  SpO2 97% Physical Exam  Constitutional: He is oriented to person, place, and time. He appears well-developed and well-nourished. No distress.  HENT:  Head: Normocephalic and atraumatic.  Right Ear: Tympanic membrane and ear canal normal.  Left Ear: Tympanic membrane and ear canal normal.  Nose: Mucosal edema present. No rhinorrhea.  Mouth/Throat: Uvula is midline and mucous membranes are normal. No trismus in the jaw. No uvula swelling. No oropharyngeal exudate, posterior oropharyngeal edema, posterior oropharyngeal erythema or tonsillar abscesses.  Eyes: Conjunctivae are normal.  Neck: Normal range of motion and phonation normal. Neck supple. No Brudzinski's sign and no Kernig's sign noted.  Cardiovascular: Normal rate, regular rhythm and normal heart sounds.  No murmur heard. Pulmonary/Chest: Effort normal and breath sounds normal. No respiratory distress. He has no wheezes. He has no rales.  Abdominal: Soft. He exhibits no distension. There is no tenderness. There is no rebound and no guarding.  Musculoskeletal: Normal range of motion. He exhibits no edema.   Lymphadenopathy:    He has no cervical adenopathy.  Neurological: He is alert and oriented to person, place, and time. He exhibits normal muscle tone. Coordination normal.  Skin: Skin is warm and dry.  Psychiatric: He has a normal mood and affect.  Nursing note and vitals reviewed.   ED Course  Procedures (including critical care time) Labs Review Labs Reviewed - No data to display  Imaging Review No results found. I have personally reviewed and evaluated these images and lab results as part of my medical decision-making.   EKG Interpretation None      MDM   Final diagnoses:  Viral illness    Pt is well appearing, non-toxic.  Vitals stable.  Sx's likely related to viral illness and possible influenza.  He agrees to symptomatic tx with tamiflu, ibuprofen and tessalon for cough.  Advised to PCP f/u or ER return if needed. Appears stable for d/c and agrees to plan    Pauline Aus, PA-C 09/17/15 1004  Nelva Nay, MD 09/18/15 [phone]

## 2015-09-17 NOTE — ED Notes (Signed)
Non-productive cough, upper back pain with cough, body aches and reported fever, stating chills. Symptoms began yesterday.

## 2016-08-03 ENCOUNTER — Observation Stay (HOSPITAL_COMMUNITY): Payer: BLUE CROSS/BLUE SHIELD

## 2016-08-03 ENCOUNTER — Encounter (HOSPITAL_COMMUNITY): Payer: Self-pay | Admitting: Emergency Medicine

## 2016-08-03 ENCOUNTER — Observation Stay (HOSPITAL_COMMUNITY)
Admission: EM | Admit: 2016-08-03 | Discharge: 2016-08-05 | Disposition: A | Discharge status: Home / Self Care | Payer: BLUE CROSS/BLUE SHIELD | Attending: Cardiology | Admitting: Cardiology

## 2016-08-03 ENCOUNTER — Encounter (HOSPITAL_COMMUNITY)
Admission: EM | Disposition: A | Discharge status: Home / Self Care | Payer: Self-pay | Source: Home / Self Care | Attending: Emergency Medicine

## 2016-08-03 ENCOUNTER — Emergency Department (HOSPITAL_BASED_OUTPATIENT_CLINIC_OR_DEPARTMENT_OTHER): Payer: BLUE CROSS/BLUE SHIELD

## 2016-08-03 ENCOUNTER — Emergency Department (HOSPITAL_COMMUNITY): Payer: BLUE CROSS/BLUE SHIELD

## 2016-08-03 ENCOUNTER — Ambulatory Visit (HOSPITAL_COMMUNITY): Admit: 2016-08-03 | Payer: BLUE CROSS/BLUE SHIELD | Admitting: Internal Medicine

## 2016-08-03 DIAGNOSIS — R7989 Other specified abnormal findings of blood chemistry: Secondary | ICD-10-CM | POA: Diagnosis not present

## 2016-08-03 DIAGNOSIS — Z8249 Family history of ischemic heart disease and other diseases of the circulatory system: Secondary | ICD-10-CM | POA: Insufficient documentation

## 2016-08-03 DIAGNOSIS — F17200 Nicotine dependence, unspecified, uncomplicated: Secondary | ICD-10-CM

## 2016-08-03 DIAGNOSIS — R748 Abnormal levels of other serum enzymes: Secondary | ICD-10-CM | POA: Diagnosis not present

## 2016-08-03 DIAGNOSIS — F17210 Nicotine dependence, cigarettes, uncomplicated: Secondary | ICD-10-CM | POA: Insufficient documentation

## 2016-08-03 DIAGNOSIS — Z23 Encounter for immunization: Secondary | ICD-10-CM | POA: Diagnosis not present

## 2016-08-03 DIAGNOSIS — R0789 Other chest pain: Secondary | ICD-10-CM | POA: Insufficient documentation

## 2016-08-03 DIAGNOSIS — R079 Chest pain, unspecified: Secondary | ICD-10-CM | POA: Diagnosis not present

## 2016-08-03 DIAGNOSIS — I319 Disease of pericardium, unspecified: Secondary | ICD-10-CM

## 2016-08-03 DIAGNOSIS — I309 Acute pericarditis, unspecified: Principal | ICD-10-CM | POA: Insufficient documentation

## 2016-08-03 DIAGNOSIS — F1290 Cannabis use, unspecified, uncomplicated: Secondary | ICD-10-CM | POA: Diagnosis not present

## 2016-08-03 DIAGNOSIS — I300 Acute nonspecific idiopathic pericarditis: Secondary | ICD-10-CM

## 2016-08-03 DIAGNOSIS — R778 Other specified abnormalities of plasma proteins: Secondary | ICD-10-CM

## 2016-08-03 DIAGNOSIS — I249 Acute ischemic heart disease, unspecified: Secondary | ICD-10-CM | POA: Insufficient documentation

## 2016-08-03 HISTORY — DX: Acute pericarditis, unspecified: I30.9

## 2016-08-03 HISTORY — DX: Tobacco use: Z72.0

## 2016-08-03 HISTORY — PX: CARDIAC CATHETERIZATION: SHX172

## 2016-08-03 LAB — CBC
HCT: 42.8 % (ref 39.0–52.0)
HEMATOCRIT: 41.8 % (ref 39.0–52.0)
HEMOGLOBIN: 14.5 g/dL (ref 13.0–17.0)
HEMOGLOBIN: 14.1 g/dL (ref 13.0–17.0)
MCH: 31.4 pg (ref 26.0–34.0)
MCH: 31.3 pg (ref 26.0–34.0)
MCHC: 33.9 g/dL (ref 30.0–36.0)
MCHC: 33.7 g/dL (ref 30.0–36.0)
MCV: 92.6 fL (ref 78.0–100.0)
MCV: 92.7 fL (ref 78.0–100.0)
Platelets: 224 10*3/uL (ref 150–400)
Platelets: 205 10*3/uL (ref 150–400)
RBC: 4.62 MIL/uL (ref 4.22–5.81)
RBC: 4.51 MIL/uL (ref 4.22–5.81)
RDW: 12.6 % (ref 11.5–15.5)
RDW: 12.5 % (ref 11.5–15.5)
WBC: 9.6 10*3/uL (ref 4.0–10.5)
WBC: 10.9 10*3/uL — ABNORMAL HIGH (ref 4.0–10.5)

## 2016-08-03 LAB — COMPREHENSIVE METABOLIC PANEL
ALBUMIN: 4.3 g/dL (ref 3.5–5.0)
ALT: 17 U/L (ref 17–63)
AST: 28 U/L (ref 15–41)
Alkaline Phosphatase: 55 U/L (ref 38–126)
Anion gap: 8 (ref 5–15)
BUN: 14 mg/dL (ref 6–20)
CHLORIDE: 102 mmol/L (ref 101–111)
CO2: 28 mmol/L (ref 22–32)
Calcium: 9.3 mg/dL (ref 8.9–10.3)
Creatinine, Ser: 0.99 mg/dL (ref 0.61–1.24)
GFR calc Af Amer: >60 mL/min (ref >60)
GFR calc non Af Amer: >60 mL/min (ref >60)
GLUCOSE: 129 mg/dL — AB (ref 65–99)
POTASSIUM: 3.1 mmol/L — AB (ref 3.5–5.1)
Sodium: 138 mmol/L (ref 135–145)
Total Bilirubin: 0.4 mg/dL (ref 0.3–1.2)
Total Protein: 7.8 g/dL (ref 6.5–8.1)

## 2016-08-03 LAB — MRSA PCR SCREENING: MRSA by PCR: NEGATIVE

## 2016-08-03 LAB — ECHOCARDIOGRAM LIMITED
HEIGHTINCHES: 70 in
WEIGHTICAEL: 2480 [oz_av]

## 2016-08-03 LAB — CREATININE, SERUM
CREATININE: 0.82 mg/dL (ref 0.61–1.24)
GFR calc Af Amer: >60 mL/min (ref >60)

## 2016-08-03 LAB — ECHOCARDIOGRAM COMPLETE
HEIGHTINCHES: 70 in
WEIGHTICAEL: 2480 [oz_av]

## 2016-08-03 LAB — I-STAT TROPONIN, ED: Troponin i, poc: 2.39 ng/mL (ref 0.00–0.08)

## 2016-08-03 LAB — TSH: TSH: 1.808 u[IU]/mL (ref 0.350–4.500)

## 2016-08-03 LAB — C-REACTIVE PROTEIN: CRP: 2.2 mg/dL — AB (ref <1.0)

## 2016-08-03 LAB — TROPONIN I
TROPONIN I: 9.93 ng/mL — AB (ref <0.03)
TROPONIN I: 12.34 ng/mL — AB (ref <0.03)

## 2016-08-03 LAB — SEDIMENTATION RATE: SED RATE: 1 mm/h (ref 0–16)

## 2016-08-03 SURGERY — LEFT HEART CATH AND CORONARY ANGIOGRAPHY
Anesthesia: LOCAL

## 2016-08-03 MED ORDER — ASPIRIN 81 MG PO CHEW
324.0000 mg | CHEWABLE_TABLET | Freq: Once | ORAL | Status: AC
  Administered 2016-08-03: 324 mg via ORAL

## 2016-08-03 MED ORDER — LIDOCAINE HCL (PF) 1 % IJ SOLN
INTRAMUSCULAR | Status: DC | PRN
  Administered 2016-08-03: 2 mL

## 2016-08-03 MED ORDER — ASPIRIN 81 MG PO CHEW: 81.0000 mg | CHEWABLE_TABLET | ORAL | Status: DC

## 2016-08-03 MED ORDER — ASPIRIN 81 MG PO CHEW
CHEWABLE_TABLET | ORAL | Status: AC
  Filled 2016-08-03: qty 4

## 2016-08-03 MED ORDER — HEPARIN SODIUM (PORCINE) 1000 UNIT/ML IJ SOLN
INTRAMUSCULAR | Status: DC | PRN
  Administered 2016-08-03: 3500 [IU] via INTRAVENOUS

## 2016-08-03 MED ORDER — ONDANSETRON HCL 4 MG PO TABS: 4.0000 mg | ORAL_TABLET | Freq: Four times a day (QID) | ORAL | Status: DC | PRN

## 2016-08-03 MED ORDER — FENTANYL CITRATE (PF) 100 MCG/2ML IJ SOLN
INTRAMUSCULAR | Status: DC | PRN
  Administered 2016-08-03: 50 ug via INTRAVENOUS

## 2016-08-03 MED ORDER — ONDANSETRON HCL 4 MG/2ML IJ SOLN: 4.0000 mg | Freq: Four times a day (QID) | INTRAMUSCULAR | Status: DC | PRN

## 2016-08-03 MED ORDER — ENOXAPARIN SODIUM 40 MG/0.4ML ~~LOC~~ SOLN
40.0000 mg | SUBCUTANEOUS | Status: DC
  Administered 2016-08-03 – 2016-08-04 (×2): 40 mg via SUBCUTANEOUS
  Filled 2016-08-03 (×2): qty 0.4

## 2016-08-03 MED ORDER — IOPAMIDOL (ISOVUE-370) INJECTION 76%
INTRAVENOUS | Status: DC | PRN
  Administered 2016-08-03: 95 mL via INTRA_ARTERIAL

## 2016-08-03 MED ORDER — SODIUM CHLORIDE 0.9% FLUSH: 3.0000 mL | INTRAVENOUS | Status: DC | PRN

## 2016-08-03 MED ORDER — INFLUENZA VAC SPLIT QUAD 0.5 ML IM SUSY
0.5000 mL | PREFILLED_SYRINGE | INTRAMUSCULAR | Status: AC
Start: 2016-08-04 — End: 2016-08-04
  Administered 2016-08-04: 0.5 mL via INTRAMUSCULAR
  Filled 2016-08-03: qty 0.5

## 2016-08-03 MED ORDER — SODIUM CHLORIDE 0.9 % IV SOLN: 250.0000 mL | INTRAVENOUS | Status: DC | PRN

## 2016-08-03 MED ORDER — MIDAZOLAM HCL 2 MG/2ML IJ SOLN
INTRAMUSCULAR | Status: DC | PRN
  Administered 2016-08-03: 1 mg via INTRAVENOUS

## 2016-08-03 MED ORDER — IBUPROFEN 200 MG PO TABS: 800.0000 mg | ORAL_TABLET | Freq: Three times a day (TID) | ORAL | Status: DC

## 2016-08-03 MED ORDER — MORPHINE SULFATE (PF) 4 MG/ML IV SOLN
4.0000 mg | Freq: Once | INTRAVENOUS | Status: AC
  Administered 2016-08-03: 4 mg via INTRAVENOUS

## 2016-08-03 MED ORDER — SODIUM CHLORIDE 0.9 % WEIGHT BASED INFUSION
1.0000 mL/kg/h | INTRAVENOUS | Status: DC
  Administered 2016-08-03: 1 mL/kg/h via INTRAVENOUS

## 2016-08-03 MED ORDER — COLCHICINE 0.6 MG PO TABS
0.6000 mg | ORAL_TABLET | Freq: Every day | ORAL | Status: DC
  Administered 2016-08-03: 0.6 mg via ORAL
  Filled 2016-08-03: qty 1

## 2016-08-03 MED ORDER — HEPARIN (PORCINE) IN NACL 2-0.9 UNIT/ML-% IJ SOLN
INTRAMUSCULAR | Status: DC | PRN
  Administered 2016-08-03: 10 mL via INTRA_ARTERIAL

## 2016-08-03 MED ORDER — MORPHINE SULFATE (PF) 4 MG/ML IV SOLN
INTRAVENOUS | Status: AC
  Filled 2016-08-03: qty 1

## 2016-08-03 MED ORDER — SENNOSIDES-DOCUSATE SODIUM 8.6-50 MG PO TABS: 1.0000 | ORAL_TABLET | Freq: Every evening | ORAL | Status: DC | PRN

## 2016-08-03 MED ORDER — SODIUM CHLORIDE 0.9% FLUSH
3.0000 mL | Freq: Two times a day (BID) | INTRAVENOUS | Status: DC
  Administered 2016-08-03 – 2016-08-05 (×4): 3 mL via INTRAVENOUS

## 2016-08-03 MED ORDER — PNEUMOCOCCAL VAC POLYVALENT 25 MCG/0.5ML IJ INJ
0.5000 mL | INJECTION | INTRAMUSCULAR | Status: AC
  Administered 2016-08-04: 0.5 mL via INTRAMUSCULAR
  Filled 2016-08-03: qty 0.5

## 2016-08-03 MED ORDER — PERFLUTREN LIPID MICROSPHERE
1.0000 mL | INTRAVENOUS | Status: DC | PRN
  Administered 2016-08-03: 2 mL via INTRAVENOUS
  Administered 2016-08-03: 1 mL via INTRAVENOUS
  Filled 2016-08-03: qty 10

## 2016-08-03 MED ORDER — HEPARIN (PORCINE) IN NACL 2-0.9 UNIT/ML-% IJ SOLN
INTRAMUSCULAR | Status: DC | PRN
  Administered 2016-08-03: 1000 mL

## 2016-08-03 MED ORDER — IBUPROFEN 800 MG PO TABS: 800.0000 mg | ORAL_TABLET | Freq: Three times a day (TID) | ORAL | Status: DC

## 2016-08-03 MED ORDER — KETOROLAC TROMETHAMINE 30 MG/ML IJ SOLN
30.0000 mg | Freq: Once | INTRAMUSCULAR | Status: AC
  Administered 2016-08-03: 30 mg via INTRAVENOUS
  Filled 2016-08-03: qty 1

## 2016-08-03 MED ORDER — COLCHICINE 0.6 MG PO TABS
0.6000 mg | ORAL_TABLET | Freq: Two times a day (BID) | ORAL | Status: DC
  Administered 2016-08-03 – 2016-08-05 (×4): 0.6 mg via ORAL
  Filled 2016-08-03 (×5): qty 1

## 2016-08-03 MED ORDER — SODIUM CHLORIDE 0.9% FLUSH: 3.0000 mL | Freq: Two times a day (BID) | INTRAVENOUS | Status: DC

## 2016-08-03 SURGICAL SUPPLY — 11 items
CATH EXPO 5F FL3.5 (CATHETERS) ×1 IMPLANT
CATH INFINITI JR4 5F (CATHETERS) ×1 IMPLANT
DEVICE RAD COMP TR BAND LRG (VASCULAR PRODUCTS) ×1 IMPLANT
ELECT DEFIB PAD ADLT CADENCE (PAD) ×1 IMPLANT
GLIDESHEATH SLEND A-KIT 6F 22G (SHEATH) ×1 IMPLANT
GUIDEWIRE INQWIRE 1.5J.035X260 (WIRE) IMPLANT
INQWIRE 1.5J .035X260CM (WIRE) ×2
KIT HEART LEFT (KITS) ×2 IMPLANT
PACK CARDIAC CATHETERIZATION (CUSTOM PROCEDURE TRAY) ×2 IMPLANT
TRANSDUCER W/STOPCOCK (MISCELLANEOUS) ×2 IMPLANT
TUBING CIL FLEX 10 FLL-RA (TUBING) ×2 IMPLANT

## 2016-08-03 NOTE — H&P (View-Only) (Signed)
Patient remains chest pain free. Follow up troponin up to 9. EKG is unchanged from prior, most consistent with pericarditis. I continue to suspect the likely diagnosis is perimyocarditis however due to the degree of troponin elevation it raises concern about possible coronary disease despite his young age and limited risk factors. Though most of the evidence supports perimyocardits, CAD/ACS is diagnosis we would not want to miss and thus we will plan for a transfer to [facility] and cath today and [facility] at [facility]. Would not anticoagulate due to suspected myocarditis.     Dominga FerryJ Harika Laidlaw MD

## 2016-08-03 NOTE — Progress Notes (Signed)
Pt to cath lab for procedure  Via stretcher.

## 2016-08-03 NOTE — Progress Notes (Signed)
*  PRELIMINARY RESULTS* Echocardiogram Limited Echocardiogram has been performed with Definity.  Stacey DrainWhite, Simrin Vegh J 08/03/2016, 1:56 PM

## 2016-08-03 NOTE — Progress Notes (Signed)
Received pt from [facility] via [facility]. Pt alert and oriented and denies any chest discomfort at this time.  IVF started and consent signed. Pt placed on monitor. Pt waiting for Cath procedure.

## 2016-08-03 NOTE — Progress Notes (Signed)
*  PRELIMINARY RESULTS* Echocardiogram 2D Echocardiogram has been performed.  Stacey DrainWhite, Valery Amedee J 08/03/2016, 12:25 PM

## 2016-08-03 NOTE — ED Notes (Signed)
EDP at the bedside, Cardiology called to bedside

## 2016-08-03 NOTE — Consult Note (Signed)
Primary cardiologist: N/A Consulting cardiologist: Dr Carlyle Dolly  Requesting physician: Dr Nat Christen Indication: chest pain  Clinical Summary Mr. Schrieber is a 25 y.o.male history of tobacco abuse but no other signficiant PMH presents with chest pain. Acute episode started around 1030AM. Tightness throughout entire chest without any other significant symptoms. Pain was constant up to 1.5 hours, resolved with IV morphine in ER and is now pain free. Only CAD risk factor is tobacco. He denies any family history of early heart disease.    P 64 bp 121/87 100% RA Labs pending EKG diffuse upsloping ST elevation, PR depression, PR elevation in aVR CXR pending Echo pending/ Preliminary read LVEF 60-65%, no WMAs.   In ER he has received ASA 325 and morphine. Trop 2.39   No Known Allergies  Medications  No home medications   Patient denies past medical history  Past Surgical History:  Procedure Laterality Date  . EXTERNAL EAR SURGERY    . TONSILLECTOMY    . tubes in ears      Patient reports grandmother with heart disease in her early 69s. No family history of early heart disease.   Social History Mr. Marotto reports that he has been smoking Cigarettes and Cigars.  He has never used smokeless tobacco. Mr. Ganas reports that he does not drink alcohol.  Review of Systems CONSTITUTIONAL: No weight loss, fever, chills, weakness or fatigue.  HEENT: Eyes: No visual loss, blurred vision, double vision or yellow sclerae. No hearing loss, sneezing, congestion, runny nose or sore throat.  SKIN: No rash or itching.  CARDIOVASCULAR: per HPI RESPIRATORY: No shortness of breath, cough or sputum.  GASTROINTESTINAL: No anorexia, nausea, vomiting or diarrhea. No abdominal pain or blood.  GENITOURINARY: no polyuria, no dysuria NEUROLOGICAL: No headache, dizziness, syncope, paralysis, ataxia, numbness or tingling in the extremities. No change in bowel or bladder control.    MUSCULOSKELETAL: No muscle, back pain, joint pain or stiffness.  HEMATOLOGIC: No anemia, bleeding or bruising.  LYMPHATICS: No enlarged nodes. No history of splenectomy.  PSYCHIATRIC: No history of depression or anxiety.      Physical Examination Blood pressure 121/87, pulse 64, temperature 97.9 F (36.6 C), temperature source Oral, resp. rate 20, height '5\' 10"'$  (1.778 m), weight 155 lb (70.3 kg), SpO2 100 %. No intake or output data in the 24 hours ending 08/03/16 1142  HEENT: sclera clear, throat clear  Cardiovascular: RRR, no m/r/g, no jvd  Respiratory:CTAB  GI: abdomen soft, NT, ND  MSK: no LE edema  Neuro: no focal deficits  Psych: appropriate affect   Lab Results  Basic Metabolic Panel: No results for input(s): NA, K, CL, CO2, GLUCOSE, BUN, CREATININE, CALCIUM, MG, PHOS in the last 168 hours.  Liver Function Tests: No results for input(s): AST, ALT, ALKPHOS, BILITOT, PROT, ALBUMIN in the last 168 hours.  CBC: No results for input(s): WBC, NEUTROABS, HGB, HCT, MCV, PLT in the last 168 hours.  Cardiac Enzymes: No results for input(s): CKTOTAL, CKMB, CKMBINDEX, TROPONINI in the last 168 hours.  BNP: Invalid input(s): POCBNP     Impression/Recommendations 1. Chest pain/Acute perimyocarditis - EKG pattern most consistent with pericarditis. History somewhat atypical as symptoms were fairly acute onset. Young patient with fairly limited CAD risk factors.  - stat echo in ER shows LVEF 60-65%, no WMAs.  - patient is pain free after receiving IV morphine - based on EKG, troponins, clinical history and risk factors, echo, and resolution of pain with IV morphine this appears to  be acute perimyocarditis as opposed to ACS - will start high dose NSAIDs and colchicine. Cycle enzymes. We will cancel acute STEMI activiation at this time.  - check ESR, CRP, ANA - give IV dose of toradol now, start ibuprofen '800mg'$  tid along with colchicine 0.'6mg'$  daily. There is not a role  for IV heparin at this time - patient to be admitted to medicine at [facility].    Carlyle Dolly, M.D.

## 2016-08-03 NOTE — Interval H&P Note (Signed)
Cath Lab Visit (complete for each Cath Lab visit)  Clinical Evaluation Leading to the Procedure:   ACS: Yes.    Non-ACS:    Anginal Classification: CCS III  Anti-ischemic medical therapy: No Therapy  Non-Invasive Test Results: No non-invasive testing performed  Prior CABG: No previous CABG      History and Physical Interval Note:  08/03/2016 6:44 PM  Hollie SalkJustin T Shedd  has presented today for surgery, with the diagnosis of Chest Pain/ + troponin  The various methods of treatment have been discussed with the patient and family. After consideration of risks, benefits and other options for treatment, the patient has consented to  Procedure(s): Left Heart Cath and Coronary Angiography (N/A) as a surgical intervention .  The patient's history has been reviewed, patient examined, no change in status, stable for surgery.  I have reviewed the patient's chart and labs.  Questions were answered to the patient's satisfaction.     Lyn RecordsHenry W Smith III

## 2016-08-03 NOTE — ED Provider Notes (Addendum)
[facility]-EMERGENCY DEPT Provider Note   CSN: [id number] Arrival date & time: 08/03/16  1111  By signing my name below, I, Vista Mink, attest that this documentation has been prepared under the direction and in the presence of Donnetta Hutching, MD. Electronically signed, Vista Mink, ED Scribe. 08/03/16. 11:36 AM.   History   Chief Complaint Chief Complaint  Patient presents with  . Chest Pain    HPI: LEVEL 5 CAVEAT DUE TO NEED FOR INTERVENTION HPI Comments: Derrick Woods is a 25 y.o. male, with no pertinent PMHx, who presents to the Emergency Department complaining of sudden onset chest pain and tightness that started one hour ago. He describes the feeling as "pressure" in the center of his chest. Pt also notes associated dyspnea and diaphoresis during the duration of this episode. Pt reports a family Hx of of cardiac issues with his grandmother but denies any personal Hx.   The history is provided by the patient. No language interpreter was used.    No past medical history on file.  There are no active problems to display for this patient.   Past Surgical History:  Procedure Laterality Date  . EXTERNAL EAR SURGERY    . TONSILLECTOMY    . tubes in ears       Home Medications    Prior to Admission medications   Medication Sig Start Date End Date Taking? Authorizing Provider  benzonatate (TESSALON) 100 MG capsule Take 2 capsules (200 mg total) by mouth 3 (three) times daily as needed for cough. Swallow whole, do not chew 09/17/15   Tammy Triplett, PA-C  ibuprofen (ADVIL,MOTRIN) 800 MG tablet Take 1 tablet (800 mg total) by mouth 3 (three) times daily. 09/17/15   Tammy Triplett, PA-C  oseltamivir (TAMIFLU) 75 MG capsule Take 1 capsule (75 mg total) by mouth every 12 (twelve) hours. For 5 days 09/17/15   Tammy Triplett, PA-C  predniSONE (DELTASONE) 10 MG tablet Take 6 tablets day one, 5 tablets day two, 4 tablets day three, 3 tablets day four, 2 tablets day five, then 1 tablet day six  03/27/13   Pauline Aus, PA-C    Family History No family history on file.  Social History Social History  Substance Use Topics  . Smoking status: Current Every Day Smoker    Types: Cigarettes, Cigars  . Smokeless tobacco: Never Used  . Alcohol use No     Allergies   Patient has no known allergies.   Review of Systems Review of Systems A complete 10 system review of systems was obtained and all systems are negative except as noted in the HPI and PMH.    Physical Exam Updated Vital Signs BP 121/87 (BP Location: Right Arm)   Pulse 64   Temp 97.9 F (36.6 C) (Oral)   Resp 20   Ht 5\' 10"  (1.778 m)   Wt 155 lb (70.3 kg)   SpO2 100%   BMI 22.24 kg/m   Physical Exam  Constitutional: He is oriented to person, place, and time. He appears well-developed and well-nourished.  HENT:  Head: Normocephalic and atraumatic.  Eyes: Conjunctivae are normal.  Neck: Neck supple.  Cardiovascular: Normal rate and regular rhythm.   Pulmonary/Chest: Effort normal and breath sounds normal.  Abdominal: Soft. Bowel sounds are normal.  Musculoskeletal: Normal range of motion.  Neurological: He is alert and oriented to person, place, and time.  Skin: Skin is warm and dry.  Psychiatric: He has a normal mood and affect. His behavior is normal.  Nursing note and vitals reviewed.    ED Treatments / Results  DIAGNOSTIC STUDIES: Oxygen Saturation is 100% on RA, normal by my interpretation.  COORDINATION OF CARE: 11:34 AM-ST elevation in lateral leads. Will transfer to [facility] and speak with Cardiology. Discussed treatment plan with pt at bedside and pt agreed to plan. Pt will be given 324mg  ASA and 4 of morphine.   11:39 AM - Discussed with Cardiologist who notes possibility of Pericarditis. Cardiologist on the way to see pt. Pt transfer to [facility] on hold.  Labs (all labs ordered are listed, but only abnormal results are displayed) Labs Reviewed  I-STAT TROPOININ, ED - Abnormal;  Notable for the following:       Result Value   Troponin i, poc 2.39 (*)    All other components within normal limits  COMPREHENSIVE METABOLIC PANEL  URINALYSIS, ROUTINE W REFLEX MICROSCOPIC  RAPID URINE DRUG SCREEN, HOSP PERFORMED   EKG  EKG Interpretation  Date/Time:  Friday August 03 2016 11:28:38 EST Ventricular Rate:  81 PR Interval:    QRS Duration: 101 QT Interval:  404 QTC Calculation: 469 R Axis:   94 Text Interpretation:  Sinus rhythm Borderline right axis deviation ST elevation suggests acute pericarditis Borderline prolonged QT interval Confirmed by Adriana SimasOOK  MD, Kavonte Bearse ([id number]) on 08/03/2016 11:57:25 AM       Radiology No results found.  Procedures Procedures (including critical care time)  Medications Ordered in ED Medications  aspirin chewable tablet 324 mg (324 mg Oral Given 08/03/16 1135)  morphine 4 MG/ML injection 4 mg (4 mg Intravenous Given 08/03/16 1134)     Initial Impression / Assessment and Plan / ED Course  I have reviewed the triage vital signs and the nursing notes.  Pertinent labs & imaging results that were available during my care of the patient were reviewed by me and considered in my medical decision making (see chart for details).    CRITICAL CARE Performed by: Donnetta HutchingOOK,Tarun Patchell  ?  Total critical care time: 40 minutes  Critical care time was exclusive of separately billable procedures and treating other patients.  Critical care was necessary to treat or prevent imminent or life-threatening deterioration.  Critical care was time spent personally by me on the following activities: development of treatment plan with patient and/or surrogate as well as nursing, discussions with consultants, evaluation of patient's response to treatment, examination of patient, obtaining history from patient or surrogate, ordering and performing treatments and interventions, ordering and review of laboratory studies, ordering and review of radiographic studies,  pulse oximetry and re-evaluation of patient's condition. Clinical Course as of Aug 03 1344  Fri Aug 03, 2016  1210 EKG 12-Lead [RR]    Clinical Course User Index [RR] Vista MinkRobert Ross    History, physical, EKG consistent with acute STEMI. Troponin elevated at 2.39. Could be pericarditis. Code STEMI initiated immediately. Discussed with Dr. Tonny BollmanMichael Cooper. Transfer to [facility].  1120:   Clinical scenario rediscussed with cardiologist Dr. Wyline MoodBranch.  Bedside echo performed. No myocardial wall dysmotility. Cardiologist believes pericarditis is more likely diagnosis. Admit to telemetry  Final Clinical Impressions(s) / ED Diagnoses   Final diagnoses:  ST elevation myocardial infarction (STEMI), unspecified artery (HCC)    New Prescriptions New Prescriptions   No medications on file  I personally performed the services described in this documentation, which was scribed in my presence. The recorded information has been reviewed and is accurate.      Donnetta HutchingBrian Bradden Tadros, MD 08/03/16 [phone]  Donnetta HutchingBrian Jayse Hodkinson, MD 08/03/16 1347    Donnetta HutchingBrian Rotha Cassels, MD 08/25/16 1530    Donnetta HutchingBrian Aoife Bold, MD 08/25/16 1531    Donnetta HutchingBrian Terence Bart, MD 08/27/16 1322

## 2016-08-03 NOTE — ED Notes (Signed)
CRITICAL VALUE ALERT  Critical value received:  Troponin 9.93  Date of notification:  08/03/16  Time of notification:  1510  Critical value read back:Yes.    Nurse who received alert:  RMinter, RN  MD notified (1st page):  Dr. Ardyth HarpsHernandez  Time of first page:  1512  MD notified (2nd page):  Time of second page:  Responding MD:  Dr. Ardyth HarpsHernandez  Time MD responded:  [phone]

## 2016-08-03 NOTE — Progress Notes (Signed)
Patient discussed with Dr Excell Seltzerooper. Young patient presents with chest pain, ST elevation most suggestive of acute pericarditis. We will obtain stat echo in ER to further evaluate and triage.   Dominga FerryJ Leronda Lewers MD

## 2016-08-03 NOTE — ED Triage Notes (Signed)
Onset one hour ago chest tightness and sweating.  EKG done, called RN and MD to bedside

## 2016-08-03 NOTE — Progress Notes (Signed)
Pt transferred to [facility] via [facility].  Report given to [facility].

## 2016-08-03 NOTE — Progress Notes (Signed)
Received call from Dr. Wyline MoodBranch. Altho most likely explanation for EKG changes is pericarditis, given rapid rise in troponins ACS is a diagnosis we wouldn't want to "miss". Will be transported to [facility] for a cardiac cath under the cardiology service. Will sign off for now.  Peggye PittEstela Hernandez, MD [facility] Pager: [phone]

## 2016-08-03 NOTE — H&P (Signed)
History and Physical    Derrick Woods HCW:[id number] DOB: [birth year] DOA: 08/03/2016  Referring MD/NP/PA: Nat Christen, EDP PCP: No PCP Per Patient  Patient coming from: Home  Chief Complaint: Chest pain  HPI: Derrick Woods is a 25 y.o. male with history only significant for tobacco abuse, that presents with sudden onset crushing CP about 1 hour prior to arrival (around 10:30 am). No other symptoms. Resolved after morphine given in the ED. EKG shows diffuse ST segment elevation and initially code STEMI was called prompting urgent cardiology consultation. Seen by Dr. Harl Bowie who believes the EKG pattern is most consistent with pericarditis. Code STEMI cancelled. Initial trop 2.39 up to 9.93. Emergent bedside ECHO shows EF 60-65% and no WMA. It is believed to be safe to admit him to [facility] and hence we are asked to see for further evaluation and management. Important to mention that he had a URI approximately 2 weeks ago. CAD risk factors: smoking use and family history (maternal grandmother with "massive" MI at age 22).  Past Medical/Surgical History: No past medical history on file.  Past Surgical History:  Procedure Laterality Date  . EXTERNAL EAR SURGERY    . TONSILLECTOMY    . tubes in ears      Social History:  reports that he has been smoking Cigarettes and Cigars.  He has never used smokeless tobacco. He reports that he uses drugs, including Marijuana. He reports that he does not drink alcohol.  Allergies: No Known Allergies  Family History:  Maternal grandmother with MI at age 12.  Prior to Admission medications   Medication Sig Start Date End Date Taking? Authorizing Provider  ibuprofen (ADVIL,MOTRIN) 800 MG tablet Take 1 tablet (800 mg total) by mouth 3 (three) times daily. 09/17/15  Yes Tammy Triplett, PA-C    Review of Systems:  Constitutional: Denies fever, chills, diaphoresis, appetite change and fatigue.  HEENT: Denies photophobia, eye pain, redness, hearing loss, ear  pain, congestion, sore throat, rhinorrhea, sneezing, mouth sores, trouble swallowing, neck pain, neck stiffness and tinnitus.   Respiratory: Denies SOB, DOE, cough, chest tightness,  and wheezing.   Cardiovascular: Denies chest pain, palpitations and leg swelling.  Gastrointestinal: Denies nausea, vomiting, abdominal pain, diarrhea, constipation, blood in stool and abdominal distention.  Genitourinary: Denies dysuria, urgency, frequency, hematuria, flank pain and difficulty urinating.  Endocrine: Denies: hot or cold intolerance, sweats, changes in hair or nails, polyuria, polydipsia. Musculoskeletal: Denies myalgias, back pain, joint swelling, arthralgias and gait problem.  Skin: Denies pallor, rash and wound.  Neurological: Denies dizziness, seizures, syncope, weakness, light-headedness, numbness and headaches.  Hematological: Denies adenopathy. Easy bruising, personal or family bleeding history  Psychiatric/Behavioral: Denies suicidal ideation, mood changes, confusion, nervousness, sleep disturbance and agitation    Physical Exam: Vitals:   08/03/16 1400 08/03/16 1416 08/03/16 1430 08/03/16 1515  BP: 119/75 119/75 99/63 112/64  Pulse: 68 61 63 (!) 58  Resp: _0 Temp:    97.6 F (36.4 C)  TempSrc:    Oral  SpO2: 99% 99% 99% 100%  Weight:    72.6 kg (160 lb)  Height:    _1  (1.778 m)      Constitutional: NAD, calm, comfortable Eyes: PERRL, lids and conjunctivae normal ENMT: Mucous membranes are moist. Posterior pharynx clear of any exudate or lesions.Normal dentition.  Neck: normal, supple, no masses, no thyromegaly Respiratory: clear to auscultation bilaterally, no wheezing, no crackles. Normal respiratory effort. No accessory muscle use.  Cardiovascular: Regular rate and  rhythm, no murmurs / rubs / gallops. No extremity edema. 2+ pedal pulses. No carotid bruits.  Abdomen: no tenderness, no masses palpated. No hepatosplenomegaly. Bowel sounds positive.    Musculoskeletal: no clubbing / cyanosis. No joint deformity upper and lower extremities. Good ROM, no contractures. Normal muscle tone.  Skin: no rashes, lesions, ulcers. No induration Neurologic: CN 2-12 grossly intact. Sensation intact, DTR normal. Strength 5/5 in all 4.  Psychiatric: Normal judgment and insight. Alert and oriented x 3. Normal mood.    Labs on Admission: I have personally reviewed the following labs and imaging studies  CBC:  Recent Labs Lab 08/03/16 1237  WBC 9.6  HGB 14.5  HCT 42.8  MCV 92.6  PLT 425   Basic Metabolic Panel:  Recent Labs Lab 08/03/16 1140  NA 138  K 3.1*  CL 102  CO2 28  GLUCOSE 129*  BUN 14  CREATININE 0.99  CALCIUM 9.3   GFR: Estimated Creatinine Clearance: 117.1 mL/min (by C-G formula based on SCr of 0.99 mg/dL). Liver Function Tests:  Recent Labs Lab 08/03/16 1140  AST 28  ALT 17  ALKPHOS 55  BILITOT 0.4  PROT 7.8  ALBUMIN 4.3   No results for input(s): LIPASE, AMYLASE in the last 168 hours. No results for input(s): AMMONIA in the last 168 hours. Coagulation Profile: No results for input(s): INR, PROTIME in the last 168 hours. Cardiac Enzymes:  Recent Labs Lab 08/03/16 1409  TROPONINI 9.93*   BNP (last 3 results) No results for input(s): PROBNP in the last 8760 hours. HbA1C: No results for input(s): HGBA1C in the last 72 hours. CBG: No results for input(s): GLUCAP in the last 168 hours. Lipid Profile: No results for input(s): CHOL, HDL, LDLCALC, TRIG, CHOLHDL, LDLDIRECT in the last 72 hours. Thyroid Function Tests: No results for input(s): TSH, T4TOTAL, FREET4, T3FREE, THYROIDAB in the last 72 hours. Anemia Panel: No results for input(s): VITAMINB12, FOLATE, FERRITIN, TIBC, IRON, RETICCTPCT in the last 72 hours. Urine analysis: No results found for: COLORURINE, APPEARANCEUR, LABSPEC, PHURINE, GLUCOSEU, HGBUR, BILIRUBINUR, KETONESUR, PROTEINUR, UROBILINOGEN, NITRITE, LEUKOCYTESUR Sepsis  Labs: _0 (procalcitonin:4,lacticidven:4) )No results found for this or any previous visit (from the past 240 hour(s)).   Radiological Exams on Admission: Dg Chest Portable 1 View  Result Date: 08/03/2016 CLINICAL DATA:  Chest pain for 1 our EXAM: PORTABLE CHEST 1 VIEW COMPARISON:  None. FINDINGS: The heart size and mediastinal contours are within normal limits. Both lungs are clear. The visualized skeletal structures are unremarkable. IMPRESSION: No active disease. Electronically Signed   By: Inez Catalina M.D.   On: 08/03/2016 12:01    EKG: Independently reviewed. Diffuse ST segment elevation  Assessment/Plan Principal Problem:   Pericarditis Active Problems:   Tobacco use disorder    Chest Pain/Pericarditis -Pericarditis seems to be the most likely explanation for his EKG pattern, URI 2 weeks ago fits picture as well, altho his presentation of CP is not typical for pericarditis. -ECHO with normal EF and no WMA. -Treat with NSAIDS and colchicine. -Appreciate prompt cardiology intervention and consultation. -He is currently CP free. -Inflammatory markers requested: ESR, CRP, ANA  Tobacco Abuse -Cessation counseling provided. -Nicotine patch offered, but he refuses at this time.   DVT prophylaxis: lovenox  Code Status: full code  Family Communication: patient only  Disposition Plan: likely home in 24-48 hours  Consults called: cardiology  Admission status: observation    Time Spent: 75 minutes  Lelon Frohlich MD [facility] Pager [phone]  If 7PM-7AM, please contact night-coverage  [email or web address] Password [email or web address]  08/03/2016, 3:31 PM

## 2016-08-03 NOTE — Progress Notes (Addendum)
Patient remains chest pain free. Follow up troponin up to 9. EKG is unchanged from prior, most consistent with pericarditis. I continue to suspect the likely diagnosis is perimyocarditis however due to the degree of troponin elevation it raises concern about possible coronary disease despite his young age and limited risk factors. Though most of the evidence supports perimyocardits, CAD/ACS is diagnosis we would not want to miss and thus we will plan for a transfer to [place] and cath today and to our cardiology service at [facility]. Would not anticoagulate due to suspected myocarditis.     J Shaheem Pichon MD 

## 2016-08-04 DIAGNOSIS — I249 Acute ischemic heart disease, unspecified: Secondary | ICD-10-CM

## 2016-08-04 LAB — BASIC METABOLIC PANEL
ANION GAP: 13 (ref 5–15)
BUN: 11 mg/dL (ref 6–20)
CALCIUM: 9.1 mg/dL (ref 8.9–10.3)
CO2: 22 mmol/L (ref 22–32)
Chloride: 103 mmol/L (ref 101–111)
Creatinine, Ser: 0.79 mg/dL (ref 0.61–1.24)
GFR calc Af Amer: >60 mL/min (ref >60)
GFR calc non Af Amer: >60 mL/min (ref >60)
GLUCOSE: 105 mg/dL — AB (ref 65–99)
Potassium: 3.6 mmol/L (ref 3.5–5.1)
Sodium: 138 mmol/L (ref 135–145)

## 2016-08-04 LAB — CBC
HCT: 37.9 % — ABNORMAL LOW (ref 39.0–52.0)
HEMOGLOBIN: 12.8 g/dL — AB (ref 13.0–17.0)
MCH: 30.8 pg (ref 26.0–34.0)
MCHC: 33.8 g/dL (ref 30.0–36.0)
MCV: 91.3 fL (ref 78.0–100.0)
Platelets: 190 10*3/uL (ref 150–400)
RBC: 4.15 MIL/uL — ABNORMAL LOW (ref 4.22–5.81)
RDW: 12.7 % (ref 11.5–15.5)
WBC: 6.3 10*3/uL (ref 4.0–10.5)

## 2016-08-04 LAB — TROPONIN I: TROPONIN I: 5.76 ng/mL — AB (ref <0.03)

## 2016-08-04 MED ORDER — IBUPROFEN 600 MG PO TABS
600.0000 mg | ORAL_TABLET | Freq: Three times a day (TID) | ORAL | Status: DC
  Administered 2016-08-04 – 2016-08-05 (×3): 600 mg via ORAL
  Filled 2016-08-04: qty 3
  Filled 2016-08-04 (×2): qty 1

## 2016-08-04 MED ORDER — IBUPROFEN 200 MG PO TABS
800.0000 mg | ORAL_TABLET | Freq: Three times a day (TID) | ORAL | Status: DC
  Administered 2016-08-04: 800 mg via ORAL
  Filled 2016-08-04: qty 4

## 2016-08-04 NOTE — Progress Notes (Signed)
CRITICAL VALUE ALERT  Critical value received: Troponin 5.76 Date of notification: 08/04/2016  Time of notification:  0745  Critical value read back:Yes.    Nurse who received alert:  Deneise LeverPatricia Brooklen Runquist RN  MD notified (1st page):  Expected value, MD not paged, troponin trending down  Time of first page:  0745  MD notified (2nd page):  Time of second page:  Responding MD:  MD not paged, expected value  Time MD responded:  0745

## 2016-08-04 NOTE — Progress Notes (Signed)
Patient Name: Derrick Woods      SUBJECTIVE: 25 [facility] felt to be consistent with pericarditis, but with+ Tn referred for cath.  Cath>> normal CA with some wall motion abnormaties felt consistent with myopericarditis  Echo EF 65%   History reviewed. No pertinent past medical history.  Scheduled Meds:peri  Scheduled Meds: . colchicine  0.6 mg Oral BID  . enoxaparin (LOVENOX) injection  40 mg Subcutaneous Q24H  . ibuprofen  800 mg Oral Q8H  . sodium chloride flush  3 mL Intravenous Q12H   Continuous Infusions: sodium chloride, ondansetron **OR** ondansetron (ZOFRAN) IV, senna-docusate    PHYSICAL EXAM Vitals:   08/04/16 0000 08/04/16 0423 08/04/16 0800 08/04/16 0815  BP: 110/71 125/77 (!) 103/55 (!) 103/55  Pulse: (!) 56 60 64 60  Resp: 16 11 18 17   Temp:  98 F (36.7 C)  97.7 F (36.5 C)  TempSrc:  Oral  Oral  SpO2: 97% 99% 100% 100%  Weight:      Height:       Well developed and nourished in no acute distress HENT normal Neck supple with JVP 7-8 Clear Regular rate and rhythm, no murmurs or gallops no rub Abd-soft with active BS No Clubbing cyanosis edema Skin-warm and dry A & Oriented  Grossly normal sensory and motor function   TELEMETRY: Reviewed personnally pt in sinus  ECG personally reviewed 3 sinus with ST elevation diffusely    Intake/Output Summary (Last 24 hours) at 08/04/16 1037 Last data filed at 08/04/16 0900  Gross per 24 hour  Intake              240 ml  Output              350 ml  Net             -110 ml    LABS: Basic Metabolic Panel:  Recent Labs Lab 08/03/16 1140 08/03/16 1542 08/04/16 0557  NA 138  --  138  K 3.1*  --  3.6  CL 102  --  103  CO2 28  --  22  GLUCOSE 129*  --  105*  BUN 14  --  11  CREATININE 0.99 0.82 0.79  CALCIUM 9.3  --  9.1   Cardiac Enzymes:  Recent Labs  08/03/16 1409 08/03/16 1542 08/04/16 0557  TROPONINI 9.93* 12.34* 5.76*   CBC:  Recent Labs Lab  08/03/16 1237 08/03/16 1542 08/04/16 0557  WBC 9.6 10.9* 6.3  HGB 14.5 14.1 12.8*  HCT 42.8 41.8 37.9*  MCV 92.6 92.7 91.3  PLT 224 205 190   PROTIME: No results for input(s): LABPROT, INR in the last 72 hours. Liver Function Tests:  Recent Labs  08/03/16 1140  AST 28  ALT 17  ALKPHOS 55  BILITOT 0.4  PROT 7.8  ALBUMIN 4.3   No results for input(s): LIPASE, AMYLASE in the last 72 hours. BNP: BNP (last 3 results) No results for input(s): BNP in the last 8760 hours.  ProBNP (last 3 results) No results for input(s): PROBNP in the last 8760 hours.  D-Dimer: No results for input(s): DDIMER in the last 72 hours. Hemoglobin A1C: No results for input(s): HGBA1C in the last 72 hours. Fasting Lipid Panel: No results for input(s): CHOL, HDL, LDLCALC, TRIG, CHOLHDL, LDLDIRECT in the last 72 hours. Thyroid Function Tests:  Recent Labs  08/03/16 1545  TSH 1.808       ASSESSMENT  AND PLAN:  Principal Problem:   Acute coronary syndrome (HCC) Active Problems:   Pericarditis   Tobacco use disorder   Acute myopericarditis  Plan repeat echo and discharge tomorrow if no effusion  Continue ibuprofen and colchicine  With resolution of pain will decrease to 600 but maintain 3x d  Signed, Sherryl MangesSteven Halee Glynn MD  08/04/2016

## 2016-08-05 ENCOUNTER — Encounter (HOSPITAL_COMMUNITY): Payer: Self-pay | Admitting: Nurse Practitioner

## 2016-08-05 ENCOUNTER — Observation Stay (HOSPITAL_BASED_OUTPATIENT_CLINIC_OR_DEPARTMENT_OTHER): Payer: BLUE CROSS/BLUE SHIELD

## 2016-08-05 ENCOUNTER — Other Ambulatory Visit (HOSPITAL_COMMUNITY): Payer: BLUE CROSS/BLUE SHIELD

## 2016-08-05 DIAGNOSIS — J90 Pleural effusion, not elsewhere classified: Secondary | ICD-10-CM

## 2016-08-05 DIAGNOSIS — I249 Acute ischemic heart disease, unspecified: Secondary | ICD-10-CM | POA: Diagnosis not present

## 2016-08-05 DIAGNOSIS — R7989 Other specified abnormal findings of blood chemistry: Secondary | ICD-10-CM

## 2016-08-05 DIAGNOSIS — R778 Other specified abnormalities of plasma proteins: Secondary | ICD-10-CM

## 2016-08-05 LAB — ECHOCARDIOGRAM LIMITED
Height: 70 in
Weight: 2400 oz

## 2016-08-05 MED ORDER — COLCHICINE 0.6 MG PO TABS: 0.6000 mg | ORAL_TABLET | Freq: Two times a day (BID) | ORAL | 2 refills | Status: DC

## 2016-08-05 MED ORDER — OMEPRAZOLE MAGNESIUM 20 MG PO TBEC: 20.0000 mg | DELAYED_RELEASE_TABLET | Freq: Every day | ORAL | Status: DC

## 2016-08-05 MED ORDER — IBUPROFEN 200 MG PO TABS: 600.0000 mg | ORAL_TABLET | Freq: Three times a day (TID) | ORAL | Status: AC

## 2016-08-05 MED ORDER — IBUPROFEN 600 MG PO TABS: 600.0000 mg | ORAL_TABLET | Freq: Three times a day (TID) | ORAL | 0 refills | Status: DC

## 2016-08-05 NOTE — Discharge Summary (Signed)
Discharge Summary    Patient ID: Derrick Woods,  MRN: [id number], DOB/AGE: 26-Sep-1990 25 y.o.  Admit date: 08/03/2016 Discharge date: 08/05/2016  Primary Care Provider: No PCP Per Patient Primary Cardiologist: Dominga FerryJ. Branch, MD   Discharge Diagnoses    Principal Problem:   Acute myopericarditis  **Normal coronary arteries on catheterization this admission. Active Problems:   Tobacco use disorder   Elevated Troponin  Allergies No Known Allergies  Diagnostic Studies/Procedures    2D Echocardiogram 12.15.2017  Study Conclusions   - Procedure narrative: Transthoracic echocardiography. Image   quality was good. The study was technically difficult.   Intravenous contrast (Definity) was administered. - Left ventricle: Systolic function was normal. The estimated   ejection fraction was in the range of 60% to 65%. Wall motion was   normal; there were no regional wall motion abnormalities. - Limited echo with echocontrast to evaluate LV systolic function   and wall motion. _____________   Cardiac Catheterization 12.15.2017  Conclusion   Normal coronary arteries.  Anteroapical, apical, and inferoapical mild hypokinesis. Estimated ejection fraction 45-55%. Mildly elevated LVEDP.  Presenting EKG, elevated troponin I, and wall motion abnormality are consistent with myopericarditis with regional wall motion presentation.   Left Heart  Left Ventricle There is mild left ventricular systolic dysfunction. LV end diastolic pressure is mildly elevated. The left ventricular ejection fraction is 45-50% by visual estimate.    _____________  2D Echocardiogram 12.17.2017  Study Conclusions  - Left ventricle: Systolic function was normal. The estimated   ejection fraction was in the range of 55% to 60%. Left   ventricular diastolic function parameters were normal. - Atrial septum: No defect or patent foramen ovale was identified. _____________   History of Present Illness     25 y/o ? with a h /o tobacco abuse.  He has no prior cardiac history.  He was in his usual state of health until the morning of 12/15, when he developed severe chest tightness [facility].  There, he was noted to have diffuse, upsloping ST elevation with PR depression and PR elevation in aVR.  Troponin was initially elevated at 9.93 but then rose to 12.34 while still in the ED.  Bedside echo showed normal LV function without regional wall motion abnormalities.  Presentation was felt to most likely represent myopericarditis but given ongoing symptoms and rising troponin, decision was made to [facility] and [facility].  Hospital Course     Consultants: None   Pt underwent diagnostic catheterization on 12/15 revealing normal coronary arteries.  EF was 45-55% with anteroapical, apical, and inferoapical mild hypokinesis.  This was felt to be consistent with myopericarditis and he was admitted and placed on high dose ibuprofen therapy along with colchicine.  With this regimen, he has had no further chest pain and has been ambulating without difficulty.  Troponin has trended down.  He has had no ectopy on telemetry.  Repeat echocardiography was performed this AM and shows normal LV function.    Derrick Woods will be discharged home today in good condition.  He has been counseled on the importance of smoking cessation and says that he has quit.  He will remain on ibuprofen therapy for the next 10 days and has been advised to use prilosec OTC while taking high dose ibuprofen.  We will plan to keep him on colchicine therapy for three months.  Office follow-up in our [place] office has  been arranged. _____________  Discharge Vitals Blood pressure 116/62, pulse 79, temperature 98.1 F (36.7 C), temperature source Oral, resp. rate 20, height 5\' 10"  (1.778 m), weight 150 lb (68 kg), SpO2 99 %.  Filed Weights   08/03/16 1515 08/03/16  1945 08/04/16 1540  Weight: 160 lb (72.6 kg) 149 lb 12.8 oz (67.9 kg) 150 lb (68 kg)    Labs & Radiologic Studies    CBC  Recent Labs  08/03/16 1542 08/04/16 0557  WBC 10.9* 6.3  HGB 14.1 12.8*  HCT 41.8 37.9*  MCV 92.7 91.3  PLT 205 190   Basic Metabolic Panel  Recent Labs  08/03/16 1140 08/03/16 1542 08/04/16 0557  NA 138  --  138  K 3.1*  --  3.6  CL 102  --  103  CO2 28  --  22  GLUCOSE 129*  --  105*  BUN 14  --  11  CREATININE 0.99 0.82 0.79  CALCIUM 9.3  --  9.1   Liver Function Tests  Recent Labs  08/03/16 1140  AST 28  ALT 17  ALKPHOS 55  BILITOT 0.4  PROT 7.8  ALBUMIN 4.3   Cardiac Enzymes  Recent Labs  08/03/16 1409 08/03/16 1542 08/04/16 0557  TROPONINI 9.93* 12.34* 5.76*   Thyroid Function Tests  Recent Labs  08/03/16 1545  TSH 1.808   _____________  Dg Chest Portable 1 View  Result Date: 08/03/2016 CLINICAL DATA:  Chest pain for 1 our EXAM: PORTABLE CHEST 1 VIEW COMPARISON:  None. FINDINGS: The heart size and mediastinal contours are within normal limits. Both lungs are clear. The visualized skeletal structures are unremarkable. IMPRESSION: No active disease. Electronically Signed   By: Alcide CleverMark  Lukens M.D.   On: 08/03/2016 12:01   Disposition   Pt is being discharged home today in good condition.  Follow-up Plans & Appointments    Follow-up Information    Joni ReiningKathryn Lawrence, NP Follow up on 08/17/2016.   Specialties:  Nurse Practitioner, Radiology, Cardiology Why:  2:10 PM - Dr. Verna CzechBranch's NP Contact information: [address] [place] [address] [phone]           Discharge Medications   Current Discharge Medication List    START taking these medications   Details  colchicine 0.6 MG tablet Take 1 tablet (0.6 mg total) by mouth 2 (two) times daily. Qty: 60 tablet, Refills: 2    omeprazole (PRILOSEC OTC) 20 MG tablet Take 1 tablet (20 mg total) by mouth daily. While taking ibuprofen.      CONTINUE these  medications which have CHANGED   Details  ibuprofen (ADVIL,MOTRIN) 200 MG tablet Take 3 tablets (600 mg total) by mouth 3 (three) times daily.        Aspirin prescribed at discharge?  No: N/A High Intensity Statin Prescribed? (Lipitor 40-80mg  or Crestor 20-40mg ): No: N/A Beta Blocker Prescribed? No: N/A For EF <40%, was ACEI/ARB Prescribed? No: N/A ADP Receptor Inhibitor Prescribed? (i.e. Plavix etc.-Includes Medically Managed Patients): No: N/A For EF <40%, Aldosterone Inhibitor Prescribed? No: N/A Was EF assessed during THIS hospitalization? Yes Was Cardiac Rehab II ordered? (Included Medically managed Patients): No: N/A   Outstanding Labs/Studies   None  Duration of Discharge Encounter   Greater than 30 minutes including physician time.  Signed, Nicolasa Duckinghristopher Berge NP 08/05/2016, 9:28 AM  Patient examined chart reviewed Echo with no pericardial effusion and normal EF No rub on exam d/c home as above with colchicine and NSAI's  [place] in [place]  [place]

## 2016-08-05 NOTE — Progress Notes (Signed)
Patient ID: Derrick SalkJustin T Kumagai, male   DOB: [birth year], 25 y.o.   MRN: [id number]       Patient Name: Derrick Woods      SUBJECTIVE:  No chest pain or complaints this am     25 [facility] felt to be consistent with pericarditis, but with+ Tn referred for cath.  Cath>> normal CA with some wall motion abnormaties felt consistent with myopericarditis  Echo EF 65%   History reviewed. No pertinent past medical history.  Scheduled Meds:peri  Scheduled Meds: . colchicine  0.6 mg Oral BID  . enoxaparin (LOVENOX) injection  40 mg Subcutaneous Q24H  . ibuprofen  600 mg Oral Q8H  . sodium chloride flush  3 mL Intravenous Q12H   Continuous Infusions: sodium chloride, ondansetron **OR** ondansetron (ZOFRAN) IV, senna-docusate    PHYSICAL EXAM Vitals:   08/04/16 1420 08/04/16 1540 08/04/16 2054 08/05/16 0431  BP:  110/66 123/71 116/62  Pulse: 87 60 82 79  Resp: 20 18 18 20   Temp:  97.4 F (36.3 C) 97.6 F (36.4 C) 98.1 F (36.7 C)  TempSrc:  Oral Oral Oral  SpO2: 99% 100% 96% 99%  Weight:  150 lb (68 kg)    Height:  5\' 10"  (1.778 m)     Well developed and nourished in no acute distress HENT normal Neck supple with JVP 7-8 Clear Regular rate and rhythm, SEM no rub   Abd-soft with active BS No Clubbing cyanosis edema Skin-warm and dry multiple tatoos  A & Oriented  Grossly normal sensory and motor function   TELEMETRY: Reviewed personnally pt in sinus  ECG personally reviewed 3 sinus with ST elevation diffusely    Intake/Output Summary (Last 24 hours) at 08/05/16 0903 Last data filed at 08/04/16 1300  Gross per 24 hour  Intake              480 ml  Output              475 ml  Net                5 ml    LABS: Basic Metabolic Panel:  Recent Labs Lab 08/03/16 1140 08/03/16 1542 08/04/16 0557  NA 138  --  138  K 3.1*  --  3.6  CL 102  --  103  CO2 28  --  22  GLUCOSE 129*  --  105*  BUN 14  --  11  CREATININE 0.99 0.82 0.79    CALCIUM 9.3  --  9.1   Cardiac Enzymes:  Recent Labs  08/03/16 1409 08/03/16 1542 08/04/16 0557  TROPONINI 9.93* 12.34* 5.76*   CBC:  Recent Labs Lab 08/03/16 1237 08/03/16 1542 08/04/16 0557  WBC 9.6 10.9* 6.3  HGB 14.5 14.1 12.8*  HCT 42.8 41.8 37.9*  MCV 92.6 92.7 91.3  PLT 224 205 190   PROTIME: No results for input(s): LABPROT, INR in the last 72 hours. Liver Function Tests:  Recent Labs  08/03/16 1140  AST 28  ALT 17  ALKPHOS 55  BILITOT 0.4  PROT 7.8  ALBUMIN 4.3   No results for input(s): LIPASE, AMYLASE in the last 72 hours. BNP: BNP (last 3 results) No results for input(s): BNP in the last 8760 hours.  ProBNP (last 3 results) No results for input(s): PROBNP in the last 8760 hours.  D-Dimer: No results for input(s): DDIMER in the last 72 hours. Hemoglobin A1C: No results for input(s): HGBA1C in  the last 72 hours. Fasting Lipid Panel: No results for input(s): CHOL, HDL, LDLCALC, TRIG, CHOLHDL, LDLDIRECT in the last 72 hours. Thyroid Function Tests:  Recent Labs  08/03/16 1545  TSH 1.808       ASSESSMENT AND PLAN:  Principal Problem:   Acute coronary syndrome (HCC) Active Problems:   Pericarditis   Tobacco use disorder   Acute myopericarditis  Plan repeat echo this am if no significant effusion d/c home  Continue ibuprofen and colchicine  With resolution of pain will decrease to 600 but maintain 3x d [facility] [place]   SignedCharlton Haws, Betrice Wanat MD  08/05/2016

## 2016-08-05 NOTE — Discharge Instructions (Signed)
Nonspecific Chest Pain Chest pain can be caused by many different conditions. There is a chance that your pain could be related to something serious, such as a heart attack or a blood clot in your lungs. Chest pain can also be caused by conditions that are not life-threatening. If you have chest pain, it is very important to follow up with your doctor. Follow these instructions at home: Medicines  If you were prescribed an antibiotic medicine, take it as told by your doctor. Do not stop taking the antibiotic even if you start to feel better.  Take over-the-counter and prescription medicines only as told by your doctor. Lifestyle  Do not use any products that contain nicotine or tobacco, such as cigarettes and e-cigarettes. If you need help quitting, ask your doctor.  Do not drink alcohol.  Make lifestyle changes as told by your doctor. These may include:  Getting regular exercise. Ask your doctor for some activities that are safe for you.  Eating a heart-healthy diet. A diet specialist (dietitian) can help you to learn healthy eating options.  Staying at a healthy weight.  Managing diabetes, if needed.  Lowering your stress, as with deep breathing or spending time in nature. General instructions  Avoid any activities that make you feel chest pain.  If your chest pain is because of heartburn:  Raise (elevate) the head of your bed about 6 inches (15 cm). You can do this by putting blocks under the bed legs at the head of the bed.  Do not sleep with extra pillows under your head. That does not help heartburn.  Keep all follow-up visits as told by your doctor. This is important. This includes any further testing if your chest pain does not go away. Contact a doctor if:  Your chest pain does not go away.  You have a rash with blisters on your chest.  You have a fever.  You have chills. Get help right away if:  Your chest pain is worse.  You have a cough that gets worse, or  you cough up blood.  You have very bad (severe) pain in your belly (abdomen).  You are very weak.  You pass out (faint).  You have either of these for no clear reason:  Sudden chest discomfort.  Sudden discomfort in your arms, back, neck, or jaw.  You have shortness of breath at any time.  You suddenly start to sweat, or your skin gets clammy.  You feel sick to your stomach (nauseous).  You throw up (vomit).  You suddenly feel light-headed or dizzy.  Your heart starts to beat fast, or it feels like it is skipping beats. These symptoms may be an emergency. Do not wait to see if the symptoms will go away. Get medical help right away. Call your local emergency services (911 in the [place] Do not drive yourself to the hospital.  This information is not intended to replace advice given to you by your health care provider. Make sure you discuss any questions you have with your health care provider. Document Released: 01/23/2008 Document Revised: 04/30/2016 Document Reviewed: 04/30/2016 Elsevier Interactive Patient Education  2017 Elsevier [employer]. **PLEASE REMEMBER TO BRING ALL OF YOUR MEDICATIONS TO EACH OF YOUR FOLLOW-UP OFFICE VISITS.  NO HEAVY LIFTING X 4 WEEKS. NO SEXUAL ACTIVITY X 4 WEEKS. NO DRIVING X 2 WEEKS. NO SOAKING BATHS, HOT TUBS, POOLS, ETC., X 7 DAYS.  Radial Site Care Refer to this sheet in the next few weeks. These instructions provide  you with information on caring for yourself after your procedure. Your caregiver may also give you more specific instructions. Your treatment has been planned according to current medical practices, but problems sometimes occur. Call your caregiver if you have any problems or questions after your procedure. HOME CARE INSTRUCTIONS  You may shower the day after the procedure.Remove the bandage (dressing) and gently wash the site with plain soap and water.Gently pat the site dry.   Do not apply powder or lotion to the site.   Do not  submerge the affected site in water for 3 to 5 days.   Inspect the site at least twice daily.   Do not flex or bend the affected arm for 24 hours.   No lifting over 5 pounds (2.3 kg) for 5 days after your procedure.   Do not drive home if you are discharged the same day of the procedure. Have someone else drive you.   What to expect:  Any bruising will usually fade within 1 to 2 weeks.   Blood that collects in the tissue (hematoma) may be painful to the touch. It should usually decrease in size and tenderness within 1 to 2 weeks.  SEEK IMMEDIATE MEDICAL CARE IF:  You have unusual pain at the radial site.   You have redness, warmth, swelling, or pain at the radial site.   You have drainage (other than a small amount of blood on the dressing).   You have chills.   You have a fever or persistent symptoms for more than 72 hours.   You have a fever and your symptoms suddenly get worse.   Your arm becomes pale, cool, tingly, or numb.   You have heavy bleeding from the site. Hold pressure on the site.  _____________     10 Habits of Highly Healthy People  [facility] wants to help you get well and stay well.  Live a longer, healthier life by practicing healthy habits every day.  1.  Visit your primary care provider regularly. 2.  Make time for family and friends.  Healthy relationships are important. 3.  Take medications as directed by your provider. 4.  Maintain a healthy weight and a trim waistline. 5.  Eat healthy meals and snacks, rich in fruits, vegetables, whole grains, and lean proteins. 6.  Get moving every day - aim for 150 minutes of moderate physical activity each week. 7.  Don't smoke. 8.  Avoid alcohol or drink in moderation. 9.  Manage stress through meditation or mindful relaxation. 10.  Get seven to nine hours of quality sleep each night.  Want more information on healthy habits?  To learn more about these and other healthy habits, visit  [email or web address]. _____________

## 2016-08-05 NOTE — Progress Notes (Signed)
Pt and family educated on DC instructions and they understand teaching and have no questions.  Will continue to monitor for any changes.

## 2016-08-06 ENCOUNTER — Encounter (HOSPITAL_COMMUNITY): Payer: Self-pay | Admitting: Interventional Cardiology

## 2016-08-06 LAB — ANTINUCLEAR ANTIBODIES, IFA: ANA Ab, IFA: NEGATIVE

## 2016-08-17 ENCOUNTER — Encounter: Payer: Self-pay | Admitting: Adult Health

## 2016-08-17 ENCOUNTER — Ambulatory Visit (INDEPENDENT_AMBULATORY_CARE_PROVIDER_SITE_OTHER): Payer: BLUE CROSS/BLUE SHIELD | Admitting: Adult Health

## 2016-08-17 VITALS — BP 124/78 | HR 76 | Ht 70.0 in | Wt 155.0 lb

## 2016-08-17 DIAGNOSIS — I300 Acute nonspecific idiopathic pericarditis: Secondary | ICD-10-CM | POA: Diagnosis not present

## 2016-08-17 DIAGNOSIS — Z720 Tobacco use: Secondary | ICD-10-CM | POA: Diagnosis not present

## 2016-08-17 DIAGNOSIS — Z79899 Other long term (current) drug therapy: Secondary | ICD-10-CM

## 2016-08-17 NOTE — Progress Notes (Signed)
Name: Derrick Woods    DOB: 04/14/1991  Age: 25 y.o.  MR#: [id number]       PCP:  No PCP Per Patient      Insurance: Payor: BLUE CROSS BLUE SHIELD / Plan: BCBS OTHER / Product Type: *No Product type* /   CC:   No chief complaint on file.   VS Vitals:   08/17/16 1405  BP: 124/78  Pulse: 76  SpO2: 98%  Weight: 155 lb (70.3 kg)  Height: 5\' 10"  (1.778 m)    Weights Current Weight  08/17/16 155 lb (70.3 kg)  08/04/16 150 lb (68 kg)  09/17/15 160 lb (72.6 kg)    Blood Pressure  BP Readings from Last 3 Encounters:  08/17/16 124/78  08/05/16 116/62  09/17/15 133/76     Admit date:  (Not on file) Last encounter with RMR:  Visit date not found   Allergy Patient has no known allergies.  Current Outpatient Prescriptions  Medication Sig Dispense Refill  . colchicine 0.6 MG tablet Take 1 tablet (0.6 mg total) by mouth 2 (two) times daily. 60 tablet 2  . omeprazole (PRILOSEC OTC) 20 MG tablet Take 1 tablet (20 mg total) by mouth daily. While taking ibuprofen.     No current facility-administered medications for this visit.     Discontinued Meds:   There are no discontinued medications.  Patient Active Problem List   Diagnosis Date Noted  . Elevated troponin 08/05/2016  . Pericarditis 08/03/2016  . Tobacco use disorder 08/03/2016  . Acute coronary syndrome (HCC) 08/03/2016  . Other chest pain   . Acute myopericarditis     LABS    Component Value Date/Time   NA 138 08/04/2016 0557   NA 138 08/03/2016 1140   NA 140 08/26/2009 2012   K 3.6 08/04/2016 0557   K 3.1 (L) 08/03/2016 1140   K 3.5 08/26/2009 2012   CL 103 08/04/2016 0557   CL 102 08/03/2016 1140   CL 104 08/26/2009 2012   CO2 22 08/04/2016 0557   CO2 28 08/03/2016 1140   CO2 30 08/26/2009 2012   GLUCOSE 105 (H) 08/04/2016 0557   GLUCOSE 129 (H) 08/03/2016 1140   GLUCOSE 103 (H) 08/26/2009 2012   BUN 11 08/04/2016 0557   BUN 14 08/03/2016 1140   BUN 16 08/26/2009 2012   CREATININE 0.79 08/04/2016  0557   CREATININE 0.82 08/03/2016 1542   CREATININE 0.99 08/03/2016 1140   CALCIUM 9.1 08/04/2016 0557   CALCIUM 9.3 08/03/2016 1140   CALCIUM 9.2 08/26/2009 2012   GFRNONAA >60 08/04/2016 0557   GFRNONAA >60 08/03/2016 1542   GFRNONAA >60 08/03/2016 1140   GFRAA >60 08/04/2016 0557   GFRAA >60 08/03/2016 1542   GFRAA >60 08/03/2016 1140   CMP     Component Value Date/Time   NA 138 08/04/2016 0557   K 3.6 08/04/2016 0557   CL 103 08/04/2016 0557   CO2 22 08/04/2016 0557   GLUCOSE 105 (H) 08/04/2016 0557   BUN 11 08/04/2016 0557   CREATININE 0.79 08/04/2016 0557   CALCIUM 9.1 08/04/2016 0557   PROT 7.8 08/03/2016 1140   ALBUMIN 4.3 08/03/2016 1140   AST 28 08/03/2016 1140   ALT 17 08/03/2016 1140   ALKPHOS 55 08/03/2016 1140   BILITOT 0.4 08/03/2016 1140   GFRNONAA >60 08/04/2016 0557   GFRAA >60 08/04/2016 0557       Component Value Date/Time   WBC 6.3 08/04/2016 0557   WBC 10.9 (H)  08/03/2016 1542   WBC 9.6 08/03/2016 1237   HGB 12.8 (L) 08/04/2016 0557   HGB 14.1 08/03/2016 1542   HGB 14.5 08/03/2016 1237   HCT 37.9 (L) 08/04/2016 0557   HCT 41.8 08/03/2016 1542   HCT 42.8 08/03/2016 1237   MCV 91.3 08/04/2016 0557   MCV 92.7 08/03/2016 1542   MCV 92.6 08/03/2016 1237    Lipid Panel  No results found for: CHOL, TRIG, HDL, CHOLHDL, VLDL, LDLCALC, LDLDIRECT  ABG No results found for: PHART, PCO2ART, PO2ART, HCO3, TCO2, ACIDBASEDEF, O2SAT   Lab Results  Component Value Date   TSH 1.808 08/03/2016   BNP (last 3 results) No results for input(s): BNP in the last 8760 hours.  ProBNP (last 3 results) No results for input(s): PROBNP in the last 8760 hours.  Cardiac Panel (last 3 results) No results for input(s): CKTOTAL, CKMB, TROPONINI, RELINDX in the last 72 hours.  Iron/TIBC/Ferritin/ %Sat No results found for: IRON, TIBC, FERRITIN, IRONPCTSAT   EKG Orders placed or performed during the hospital encounter of 08/03/16  . EKG 12-Lead  . EKG 12-Lead   . EKG 12-Lead  . EKG 12-Lead  . EKG 12-Lead  . EKG 12-Lead  . ED EKG  . EKG 12-Lead  . EKG 12-Lead  . EKG     Prior Assessment and Plan Problem List as of 08/17/2016 Reviewed: 08/03/2016  6:44 PM by Lesleigh NoeHenry W Smith III, MD     Cardiovascular and Mediastinum   Pericarditis   Acute coronary syndrome [facility](HCC)   Acute myopericarditis     Other   Other chest pain   Tobacco use disorder   Elevated troponin       Imaging: Dg Chest Portable 1 View  Result Date: 08/03/2016 CLINICAL DATA:  Chest pain for 1 our EXAM: PORTABLE CHEST 1 VIEW COMPARISON:  None. FINDINGS: The heart size and mediastinal contours are within normal limits. Both lungs are clear. The visualized skeletal structures are unremarkable. IMPRESSION: No active disease. Electronically Signed   By: Alcide CleverMark  Lukens M.D.   On: 08/03/2016 12:01

## 2016-08-17 NOTE — Progress Notes (Signed)
Cardiology Office Note   Date:  08/17/2016   ID:  Derrick SalkJustin T Klingerman, DOB 1991-06-24, MRN [id number]  PCP:  No PCP Per Patient  Cardiologist:  Arlington CalixBranch/ Sritha Chauncey, NP   Chief Complaint  Patient presents with  . Chest Pain    Pericarditis      History of Present Illness: Derrick Woods is a 25 y.o. male who presents for post hospital follow-up after being admitted for acute myopericarditis, cardiac catheterization revealing normal coronary anatomy without coronary artery disease, other history includes ongoing tobacco abuse. The patient did have an echocardiogram with Definity contrast, LVEF is 60-65%. Troponin was elevated [facility] peaking at 12.34.  Cardiac Catheterization 12.15.2017  Conclusion   Normal coronary arteries.  Anteroapical, apical, and inferoapical mild hypokinesis. Estimated ejection fraction 45-55%. Mildly elevated LVEDP.  Presenting EKG, elevated troponin I, and wall motion abnormality are consistent with myopericarditis with regional wall motion presentation.   Left Heart  Left Ventricle There is mild left ventricular systolic dysfunction. LV end diastolic pressure is mildly elevated. The left ventricular ejection fraction is 45-50% by visual estimate.     On discharge she was placed on high-dose ibuprofen therapy along with colchicine. The patient's pain was controlled and he was ambulating without difficulty or symptoms. He was counseled on smoking cessation. The patient was placed on PPI over-the-counter while taking high-dose ibuprofen. He is to stay on colchicine therapy for 3 months.  He comes today without complaints. He is ready to return to work. He has paperwork to be filled out for his FMLA and insurance.   Past Medical History:  Diagnosis Date  . Acute myopericarditis    a. 07/2016 Presentation w/ ST elevation and + trop/Cath: Nl cors, EF 45-55%, mildly elevated LVEDP;  b. 07/2016 Echo: EF 60-65%, no rwma;   08/05/16 f/u echo: Ef 55-60%.  . Tobacco abuse     Past Surgical History:  Procedure Laterality Date  . CARDIAC CATHETERIZATION N/A 08/03/2016   Procedure: Left Heart Cath and Coronary Angiography;  Surgeon: Lyn RecordsHenry W Smith, MD;  [facility];  Service: Cardiovascular;  Laterality: N/A;  . EXTERNAL EAR SURGERY    . TONSILLECTOMY    . tubes in ears       Current Outpatient Prescriptions  Medication Sig Dispense Refill  . colchicine 0.6 MG tablet Take 1 tablet (0.6 mg total) by mouth 2 (two) times daily. 60 tablet 2  . omeprazole (PRILOSEC OTC) 20 MG tablet Take 1 tablet (20 mg total) by mouth daily. While taking ibuprofen.     No current facility-administered medications for this visit.     Allergies:   Patient has no known allergies.    Social History:  The patient  reports that he quit smoking about 2 weeks ago. His smoking use included Cigarettes and Cigars. He has never used smokeless tobacco. He reports that he uses drugs, including Marijuana. He reports that he does not drink alcohol.   Family History:  The patient's family history includes Cancer in his father; Tuberculosis in his father.    ROS: All other systems are reviewed and negative. Unless otherwise mentioned in H&P    PHYSICAL EXAM: VS:  BP 124/78   Pulse 76   Ht 5\' 10"  (1.778 m)   Wt 155 lb (70.3 kg)   SpO2 98%   BMI 22.24 kg/m  , BMI Body mass index is 22.24 kg/m. GEN: Well nourished, well developed, in no acute distress  HEENT: normal  Neck: no JVD, carotid bruits, or masses Cardiac: RRR; no murmurs, rubs, or gallops,no edema  Respiratory:  clear to auscultation bilaterally, normal work of breathing GI: soft, nontender, nondistended, + BS MS: no deformity or atrophy  Skin: warm and dry, no rash Multiple tattoos  Neuro:  Strength and sensation are intact Psych: euthymic mood, full affect  Recent Labs: 08/03/2016: ALT 17; TSH 1.808 08/04/2016: BUN 11; Creatinine, Ser 0.79;  Hemoglobin 12.8; Platelets 190; Potassium 3.6; Sodium 138    Lipid Panel No results found for: CHOL, TRIG, HDL, CHOLHDL, VLDL, LDLCALC, LDLDIRECT    Wt Readings from Last 3 Encounters:  08/17/16 155 lb (70.3 kg)  08/04/16 150 lb (68 kg)  09/17/15 160 lb (72.6 kg)        ASSESSMENT AND PLAN:  1.  Myocarditis: He is feeling well and tolerating colchicine and ibuprofen without GI issues. He will continue PPI. He is given a letter to return to work, but not lift over 20 lbs for an additional two weeks. 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   2.Tobacco abuse:He has stopped smoking. He is congratulated on this. Hopefully will not restart.    Current medicines are reviewed at length with the patient today.    Labs/ tests ordered today include: BMET  Orders Placed This Encounter  Procedures  . Basic Metabolic Panel (BMET)     Disposition:   FU with  3 months.  Signed, Joni ReiningKathryn Darroll Bredeson, NP  08/17/2016 2:45 PM    [facility] [address], [place], [place] [address] Phone: [phone]; Fax: [phone]

## 2016-08-17 NOTE — Patient Instructions (Signed)
Your physician recommends that you schedule a follow-up appointment in: 3 months     Your physician recommends that you continue on your current medications as directed. Please refer to the Current Medication list given to you today.    We will provide letter to return to work      Thank you for choosing [facility] !

## 2016-08-29 ENCOUNTER — Emergency Department (HOSPITAL_COMMUNITY)
Admission: EM | Admit: 2016-08-29 | Discharge: 2016-08-29 | Disposition: A | Discharge status: Home / Self Care | Payer: BLUE CROSS/BLUE SHIELD | Attending: Emergency Medicine | Admitting: Emergency Medicine

## 2016-08-29 ENCOUNTER — Encounter: Payer: Self-pay | Admitting: Cardiology

## 2016-08-29 ENCOUNTER — Encounter (HOSPITAL_COMMUNITY): Payer: Self-pay

## 2016-08-29 DIAGNOSIS — Z87891 Personal history of nicotine dependence: Secondary | ICD-10-CM | POA: Insufficient documentation

## 2016-08-29 DIAGNOSIS — Z79899 Other long term (current) drug therapy: Secondary | ICD-10-CM | POA: Diagnosis not present

## 2016-08-29 DIAGNOSIS — R21 Rash and other nonspecific skin eruption: Secondary | ICD-10-CM | POA: Insufficient documentation

## 2016-08-29 MED ORDER — PREDNISONE 20 MG PO TABS
40.0000 mg | ORAL_TABLET | Freq: Once | ORAL | Status: AC
  Administered 2016-08-29: 40 mg via ORAL
  Filled 2016-08-29: qty 2

## 2016-08-29 MED ORDER — LORATADINE 10 MG PO TABS
10.0000 mg | ORAL_TABLET | Freq: Once | ORAL | Status: AC
  Administered 2016-08-29: 10 mg via ORAL
  Filled 2016-08-29: qty 1

## 2016-08-29 MED ORDER — ONDANSETRON HCL 4 MG PO TABS
4.0000 mg | ORAL_TABLET | Freq: Once | ORAL | Status: AC
  Administered 2016-08-29: 4 mg via ORAL
  Filled 2016-08-29: qty 1

## 2016-08-29 MED ORDER — DEXAMETHASONE 4 MG PO TABS: 4.0000 mg | ORAL_TABLET | Freq: Two times a day (BID) | ORAL | 0 refills | Status: DC

## 2016-08-29 NOTE — Discharge Instructions (Signed)
Please use claritin and decadron during the day. Take with food. Use benadryl at bed time for rash. Please call Dr Margo AyeHall for dermatology appointment. You may be able to be seen at his [place] office.

## 2016-08-29 NOTE — ED Triage Notes (Signed)
Pt reports woke up with fine red rash to r hand and forearm.  Denies any associated symptoms.  Reports had pericarditis and was discharged Dec 17.    Pt says is taking colcichine.

## 2016-08-29 NOTE — ED Provider Notes (Signed)
[facility]-EMERGENCY DEPT Provider Note   CSN: [id number] Arrival date & time: 08/29/16  [id number]     History   Chief Complaint Chief Complaint  Patient presents with  . Rash    HPI Derrick Woods is a 26 y.o. male.  Patient is a 26 year old male who presents to the emergency department with a complaint of rash to the right forearm and hand.  The patient states that he was recently treated for pericarditis. He has been on colchicine recently. He states the last 24 hours he woke up and noted a red rash on the right hand and forearm. This is also the arm in which he had the cardiac catheterization. He states however at this occurred in mid December. He denies use of gloves. He denies any new he lotion or body chills or soaps. He's not had on any new clothing that he can recall. He's not had anything new to eat or drink that he is aware of. He wanted to find out what was going on with rash.   The history is provided by the patient.    Past Medical History:  Diagnosis Date  . Acute myopericarditis    a. 07/2016 Presentation w/ ST elevation and + trop/Cath: Nl cors, EF 45-55%, mildly elevated LVEDP;  b. 07/2016 Echo: EF 60-65%, no rwma;  08/05/16 f/u echo: Ef 55-60%.  . Tobacco abuse     Patient Active Problem List   Diagnosis Date Noted  . Elevated troponin 08/05/2016  . Pericarditis 08/03/2016  . Tobacco use disorder 08/03/2016  . Acute coronary syndrome (HCC) 08/03/2016  . Other chest pain   . Acute myopericarditis     Past Surgical History:  Procedure Laterality Date  . CARDIAC CATHETERIZATION N/A 08/03/2016   Procedure: Left Heart Cath and Coronary Angiography;  Surgeon: Lyn RecordsHenry W Smith, MD;  [facility];  Service: Cardiovascular;  Laterality: N/A;  . EXTERNAL EAR SURGERY    . TONSILLECTOMY    . tubes in ears         Home Medications    Prior to Admission medications   Medication Sig Start Date End Date Taking? Authorizing Provider  colchicine 0.6 MG  tablet Take 1 tablet (0.6 mg total) by mouth 2 (two) times daily. 08/05/16  Yes Ok Anishristopher R Berge, NP  omeprazole (PRILOSEC OTC) 20 MG tablet Take 1 tablet (20 mg total) by mouth daily. While taking ibuprofen. 08/05/16 08/29/16 Yes Ok Anishristopher R Berge, NP    Family History Family History  Problem Relation Age of Onset  . Cancer Father   . Tuberculosis Father     Social History Social History  Substance Use Topics  . Smoking status: Former Smoker    Types: Cigarettes, Cigars    Quit date: 08/03/2016  . Smokeless tobacco: Never Used  . Alcohol use No     Allergies   Patient has no known allergies.   Review of Systems Review of Systems  Constitutional: Negative for activity change.       All ROS Neg except as noted in HPI  HENT: Negative for nosebleeds.   Eyes: Negative for photophobia and discharge.  Respiratory: Negative for cough, shortness of breath and wheezing.   Cardiovascular: Negative for chest pain and palpitations.  Gastrointestinal: Negative for abdominal pain and blood in stool.  Genitourinary: Negative for dysuria, frequency and hematuria.  Musculoskeletal: Negative for arthralgias, back pain and neck pain.  Skin: Positive for rash.  Neurological: Negative for dizziness, seizures and speech difficulty.  Psychiatric/Behavioral: Negative for confusion and hallucinations.     Physical Exam Updated Vital Signs BP 133/82 (BP Location: Left Arm)   Pulse 96   Resp 18   Ht 5' (1.524 m)   Wt 70.3 kg   SpO2 98%   BMI 30.27 kg/m   Physical Exam  Constitutional: He is oriented to person, place, and time. He appears well-developed and well-nourished.  Non-toxic appearance.  HENT:  Head: Normocephalic.  Right Ear: Tympanic membrane and external ear normal.  Left Ear: Tympanic membrane and external ear normal.  Eyes: EOM and lids are normal. Pupils are equal, round, and reactive to light.  Neck: Normal range of motion. Neck supple. Carotid bruit is not  present.  Cardiovascular: Normal rate, regular rhythm, normal heart sounds, intact distal pulses and normal pulses.   Pulmonary/Chest: Breath sounds normal. No respiratory distress.  Abdominal: Soft. Bowel sounds are normal. There is no tenderness. There is no guarding.  Musculoskeletal: Normal range of motion.  FROM of right shoulder, elbow, wrist and fingers. No hot joints.   Lymphadenopathy:       Head (right side): No submandibular adenopathy present.       Head (left side): No submandibular adenopathy present.    He has no cervical adenopathy.  Neurological: He is alert and oriented to person, place, and time. He has normal strength. No cranial nerve deficit or sensory deficit.  Skin: Skin is warm and dry.  Fine macular rash on the dorsum of the hand and wrist. No hot areas.  Psychiatric: He has a normal mood and affect. His speech is normal.  Nursing note and vitals reviewed.    ED Treatments / Results  Labs (all labs ordered are listed, but only abnormal results are displayed) Labs Reviewed - No data to display  EKG  EKG Interpretation None       Radiology No results found.  Procedures Procedures (including critical care time)  Medications Ordered in ED Medications - No data to display   Initial Impression / Assessment and Plan / ED Course  I have reviewed the triage vital signs and the nursing notes.  Pertinent labs & imaging results that were available during my care of the patient were reviewed by me and considered in my medical decision making (see chart for details).  Clinical Course     *I have reviewed nursing notes, vital signs, and all appropriate lab and imaging results for this patient.**  Final Clinical Impressions(s) / ED Diagnoses Vital signs well within normal limits. There is no red streaking or drainage noted related to the rash on the right hand and forearm. I discussed with the patient that the rash does not seem to show a well recognized  pattern. The patient will be treated for now with Claritin during the day, Benadryl at bedtime, and I have suggested a dermatology consult for the patient. Patient is in agreement with this plan.     Final diagnoses:  None    New Prescriptions New Prescriptions   No medications on file     Ivery Quale, PA-C 09/01/16 1435    Bethann Berkshire, MD 09/10/16 [phone]

## 2016-09-11 ENCOUNTER — Telehealth: Payer: Self-pay | Admitting: Adult Health

## 2016-09-11 NOTE — Telephone Encounter (Signed)
He will need to be seen if this is the case. Please confirm once you are able to talk to him

## 2016-09-11 NOTE — Telephone Encounter (Signed)
Patient has had 13 lb weight gain since starting on Colchicine. Swelling in legs and ankles. / tg

## 2016-09-11 NOTE — Telephone Encounter (Signed)
Will forward to Harriet PhoK Lawrence NP for direction. I attempted to reach patient,got answering machine

## 2016-09-12 ENCOUNTER — Telehealth: Payer: Self-pay | Admitting: Adult Health

## 2016-09-12 NOTE — Telephone Encounter (Signed)
Pt has appt with [facility] on Jan. 29

## 2016-09-12 NOTE — Telephone Encounter (Signed)
Returning call from yesterday, please try to call before 2, pt has to work

## 2016-09-12 NOTE — Telephone Encounter (Signed)
Called patient. No answer. Left message to call back.  

## 2016-09-12 NOTE — Telephone Encounter (Signed)
Spoke with pt. [facility]

## 2016-09-17 ENCOUNTER — Ambulatory Visit (INDEPENDENT_AMBULATORY_CARE_PROVIDER_SITE_OTHER): Payer: BLUE CROSS/BLUE SHIELD | Admitting: Adult Health

## 2016-09-17 ENCOUNTER — Encounter: Payer: Self-pay | Admitting: Adult Health

## 2016-09-17 VITALS — BP 120/70 | HR 65 | Ht 70.0 in | Wt 171.0 lb

## 2016-09-17 DIAGNOSIS — I309 Acute pericarditis, unspecified: Secondary | ICD-10-CM | POA: Diagnosis not present

## 2016-09-17 DIAGNOSIS — Z87891 Personal history of nicotine dependence: Secondary | ICD-10-CM

## 2016-09-17 NOTE — Progress Notes (Signed)
Cardiology Office Note   Date:  09/17/2016   ID:  Derrick Woods, DOB [birth year], MRN [id number]  PCP:  No PCP Per Patient  Cardiologist: Arlington Calix, NP   No chief complaint on file.     History of Present Illness: Derrick Woods is a 26 y.o. male who presents for  who presents for post hospital follow-up after being admitted for acute myopericarditis, cardiac catheterization revealing normal coronary anatomy without coronary artery disease, other history includes ongoing tobacco abuse. The patient did have an echocardiogram with Definity contrast, LVEF is 60-65%. Troponin was elevated prior to transfer from [facility] peaking at 12.34.  Cardiac Catheterization12.15.2017  Conclusion   Normal coronary arteries.  Anteroapical, apical, and inferoapical mild hypokinesis. Estimated ejection fraction 45-55%. Mildly elevated LVEDP.  Presenting EKG, elevated troponin I, and wall motion abnormality are consistent with myopericarditis with regional wall motion presentation.   Left Heart  Left Ventricle There is mild left ventricular systolic dysfunction. LV end diastolic pressure is mildly elevated. The left ventricular ejection fraction is 45-     He was last seen in the office on 08/17/2016, he was feeling well and tolerating colchicine and ibuprofen without GI issues. He was continued on PPI. He was given a letter to return to work. He had stopped smoking. Follow-up BMET was completed. All levels were in normal limits.  He is here today with complaints of some weight gain. He has stopped smoking and lose it may be related to that, but he is also having some lower extremity fluid retention. He notices usually by the end of the day after being on his feet all day. He denies any trouble breathing, he denies any issues with coughing or congestion. His energy level is normal.  Past Medical History:  Diagnosis Date  . Acute myopericarditis    a. 07/2016  Presentation w/ ST elevation and + trop/Cath: Nl cors, EF 45-55%, mildly elevated LVEDP;  b. 07/2016 Echo: EF 60-65%, no rwma;  08/05/16 f/u echo: Ef 55-60%.  . Tobacco abuse     Past Surgical History:  Procedure Laterality Date  . CARDIAC CATHETERIZATION N/A 08/03/2016   Procedure: Left Heart Cath and Coronary Angiography;  Surgeon: Lyn Records, MD;  Location: [facility] INVASIVE CV LAB;  Service: Cardiovascular;  Laterality: N/A;  . EXTERNAL EAR SURGERY    . TONSILLECTOMY    . tubes in ears       Current Outpatient Prescriptions  Medication Sig Dispense Refill  . colchicine 0.6 MG tablet Take 1 tablet (0.6 mg total) by mouth 2 (two) times daily. 60 tablet 2  . dexamethasone (DECADRON) 4 MG tablet Take 1 tablet (4 mg total) by mouth 2 (two) times daily with a meal. 10 tablet 0  . omeprazole (PRILOSEC OTC) 20 MG tablet Take 1 tablet (20 mg total) by mouth daily. While taking ibuprofen.     No current facility-administered medications for this visit.     Allergies:   Patient has no known allergies.    Social History:  The patient  reports that he quit smoking about 6 weeks ago. His smoking use included Cigarettes and Cigars. He has never used smokeless tobacco. He reports that he uses drugs, including Marijuana. He reports that he does not drink alcohol.   Family History:  The patient's family history includes Cancer in his father; Tuberculosis in his father.    ROS: All other systems are reviewed and negative. Unless otherwise mentioned in H&P  PHYSICAL EXAM: VS:  BP 120/70   Pulse 65   Ht 5\' 10"  (1.778 m)   Wt 171 lb (77.6 kg)   SpO2 98%   BMI 24.54 kg/m  , BMI Body mass index is 24.54 kg/m. GEN: Well nourished, well developed, in no acute distress  HEENT: normal  Neck: no JVD, carotid bruits, or masses Cardiac: RRR; no murmurs, rubs, or gallops,no edema  Respiratory:  Some crackles in the bases, more on the right than on the left. GI: soft, nontender, nondistended, +  BS MS: no deformity or atrophy  Skin: warm and dry, no rash Neuro:  Strength and sensation are intact Psych: euthymic mood, full affect   Recent Labs: 08/03/2016: ALT 17; TSH 1.808 08/04/2016: BUN 11; Creatinine, Ser 0.79; Hemoglobin 12.8; Platelets 190; Potassium 3.6; Sodium 138    Lipid Panel No results found for: CHOL, TRIG, HDL, CHOLHDL, VLDL, LDLCALC, LDLDIRECT    Wt Readings from Last 3 Encounters:  09/17/16 171 lb (77.6 kg)  08/29/16 155 lb (70.3 kg)  08/17/16 155 lb (70.3 kg)      Other studies Reviewed: Additional studies/ records that were reviewed today include:   Echocardiogram 08/05/2016  - Left ventricle: Systolic function was normal. The estimated   ejection fraction was in the range of 55% to 60%. Left   ventricular diastolic function parameters were normal. - Atrial septum: No defect or patent foramen ovale was identified.   ASSESSMENT AND PLAN:  1.  Chronic myocarditis: He is asymptomatic concerning chest discomfort or dyspnea. He does have some mild fluid retention at the end of the day. I believe this is related to the colchicine. I have explained this to him. He will continue his course of colchicine and may need to be taken off of it on follow-up appointment in March if he remains asymptomatic. He is no longer on Decadron or PPI.  2. History of tobacco abuse: He has stopped smoking since December. He has been eating more which she believes may also be a part of his weight gain. I have reassured him that there can be some weight gain when someone stop smoking. This should normalize out with increased activity.   Current medicines are reviewed at length with the patient today.    Labs/ tests ordered today include:  No orders of the defined types were placed in this encounter.    Disposition:   FU with regular scheduled appointment in March 2018. Signed, Joni ReiningKathryn Nidhi Jacome, NP  09/17/2016 8:31 AM    [facility] [address], [place], [place] [address] Phone: [phone]; Fax: [phone]

## 2016-09-17 NOTE — Patient Instructions (Signed)
Your physician recommends that you schedule a follow-up appointment in: March  Your physician recommends that you continue on your current medications as directed. Please refer to the Current Medication list given to you today.  If you need a refill on your cardiac medications before your next appointment, please call your pharmacy.  Thank you for choosing [facility]!

## 2016-09-17 NOTE — Progress Notes (Signed)
Name: Derrick Woods    DOB: 04-03-1991  Age: 26 y.o.  MR#: [id number]       PCP:  No PCP Per Patient      Insurance: Payor: BLUE CROSS BLUE SHIELD / Plan: BCBS OTHER / Product Type: *No Product type* /   CC:   No chief complaint on file.   VS Vitals:   09/17/16 0819  BP: 120/70  Pulse: 65  SpO2: 98%  Weight: 171 lb (77.6 kg)  Height: 5\' 10"  (1.778 m)    Weights Current Weight  09/17/16 171 lb (77.6 kg)  08/29/16 155 lb (70.3 kg)  08/17/16 155 lb (70.3 kg)    Blood Pressure  BP Readings from Last 3 Encounters:  09/17/16 120/70  08/29/16 128/72  08/17/16 124/78     Admit date:  (Not on file) Last encounter with RMR:  09/12/2016   Allergy Patient has no known allergies.  Current Outpatient Prescriptions  Medication Sig Dispense Refill  . colchicine 0.6 MG tablet Take 1 tablet (0.6 mg total) by mouth 2 (two) times daily. 60 tablet 2  . dexamethasone (DECADRON) 4 MG tablet Take 1 tablet (4 mg total) by mouth 2 (two) times daily with a meal. 10 tablet 0  . omeprazole (PRILOSEC OTC) 20 MG tablet Take 1 tablet (20 mg total) by mouth daily. While taking ibuprofen.     No current facility-administered medications for this visit.     Discontinued Meds:   There are no discontinued medications.  Patient Active Problem List   Diagnosis Date Noted  . Elevated troponin 08/05/2016  . Pericarditis 08/03/2016  . Tobacco use disorder 08/03/2016  . Acute coronary syndrome (HCC) 08/03/2016  . Other chest pain   . Acute myopericarditis     LABS    Component Value Date/Time   NA 138 08/04/2016 0557   NA 138 08/03/2016 1140   NA 140 08/26/2009 2012   K 3.6 08/04/2016 0557   K 3.1 (L) 08/03/2016 1140   K 3.5 08/26/2009 2012   CL 103 08/04/2016 0557   CL 102 08/03/2016 1140   CL 104 08/26/2009 2012   CO2 22 08/04/2016 0557   CO2 28 08/03/2016 1140   CO2 30 08/26/2009 2012   GLUCOSE 105 (H) 08/04/2016 0557   GLUCOSE 129 (H) 08/03/2016 1140   GLUCOSE 103 (H) 08/26/2009  2012   BUN 11 08/04/2016 0557   BUN 14 08/03/2016 1140   BUN 16 08/26/2009 2012   CREATININE 0.79 08/04/2016 0557   CREATININE 0.82 08/03/2016 1542   CREATININE 0.99 08/03/2016 1140   CALCIUM 9.1 08/04/2016 0557   CALCIUM 9.3 08/03/2016 1140   CALCIUM 9.2 08/26/2009 2012   GFRNONAA >60 08/04/2016 0557   GFRNONAA >60 08/03/2016 1542   GFRNONAA >60 08/03/2016 1140   GFRAA >60 08/04/2016 0557   GFRAA >60 08/03/2016 1542   GFRAA >60 08/03/2016 1140   CMP     Component Value Date/Time   NA 138 08/04/2016 0557   K 3.6 08/04/2016 0557   CL 103 08/04/2016 0557   CO2 22 08/04/2016 0557   GLUCOSE 105 (H) 08/04/2016 0557   BUN 11 08/04/2016 0557   CREATININE 0.79 08/04/2016 0557   CALCIUM 9.1 08/04/2016 0557   PROT 7.8 08/03/2016 1140   ALBUMIN 4.3 08/03/2016 1140   AST 28 08/03/2016 1140   ALT 17 08/03/2016 1140   ALKPHOS 55 08/03/2016 1140   BILITOT 0.4 08/03/2016 1140   GFRNONAA >60 08/04/2016 0557   GFRAA >60  08/04/2016 0557       Component Value Date/Time   WBC 6.3 08/04/2016 0557   WBC 10.9 (H) 08/03/2016 1542   WBC 9.6 08/03/2016 1237   HGB 12.8 (L) 08/04/2016 0557   HGB 14.1 08/03/2016 1542   HGB 14.5 08/03/2016 1237   HCT 37.9 (L) 08/04/2016 0557   HCT 41.8 08/03/2016 1542   HCT 42.8 08/03/2016 1237   MCV 91.3 08/04/2016 0557   MCV 92.7 08/03/2016 1542   MCV 92.6 08/03/2016 1237    Lipid Panel  No results found for: CHOL, TRIG, HDL, CHOLHDL, VLDL, LDLCALC, LDLDIRECT  ABG No results found for: PHART, PCO2ART, PO2ART, HCO3, TCO2, ACIDBASEDEF, O2SAT   Lab Results  Component Value Date   TSH 1.808 08/03/2016   BNP (last 3 results) No results for input(s): BNP in the last 8760 hours.  ProBNP (last 3 results) No results for input(s): PROBNP in the last 8760 hours.  Cardiac Panel (last 3 results) No results for input(s): CKTOTAL, CKMB, TROPONINI, RELINDX in the last 72 hours.  Iron/TIBC/Ferritin/ %Sat No results found for: IRON, TIBC, FERRITIN,  IRONPCTSAT   EKG Orders placed or performed during the hospital encounter of 08/03/16  . EKG 12-Lead  . EKG 12-Lead  . EKG 12-Lead  . EKG 12-Lead  . EKG 12-Lead  . EKG 12-Lead  . ED EKG  . EKG 12-Lead  . EKG 12-Lead  . EKG     Prior Assessment and Plan Problem List as of 09/17/2016 Reviewed: 08/17/2016  2:45 PM by Joni ReiningKathryn Lawrence, NP     Cardiovascular and Mediastinum   Pericarditis   Acute coronary syndrome (HCC)   Acute myopericarditis     Other   Other chest pain   Tobacco use disorder   Elevated troponin       Imaging: No results found.

## 2016-10-31 ENCOUNTER — Encounter: Payer: Self-pay | Admitting: Cardiology

## 2016-10-31 ENCOUNTER — Ambulatory Visit: Payer: BLUE CROSS/BLUE SHIELD | Admitting: Cardiology

## 2016-10-31 NOTE — Progress Notes (Deleted)
     Clinical Summary Mr. Cresenciano Genreruitt is a 26 y.o.male seen today for follow up of the following medical problems.  1. Perimyocarditis - admit 07/2016 with chest pain, elevated troponin with peak of 12 - cath with normal coronaries. Did show anteroapical/apical/inferoapical hypokinesis.  - echo 07/2016 LVEF 55-60% - discharged on colchicine 0.6mg  bid and ibuprofen 600mg  tid  Past Medical History:  Diagnosis Date  . Acute myopericarditis    a. 07/2016 Presentation w/ ST elevation and + trop/Cath: Nl cors, EF 45-55%, mildly elevated LVEDP;  b. 07/2016 Echo: EF 60-65%, no rwma;  08/05/16 f/u echo: Ef 55-60%.  . Tobacco abuse      No Known Allergies   Current Outpatient Prescriptions  Medication Sig Dispense Refill  . colchicine 0.6 MG tablet Take 1 tablet (0.6 mg total) by mouth 2 (two) times daily. 60 tablet 2   No current facility-administered medications for this visit.      Past Surgical History:  Procedure Laterality Date  . CARDIAC CATHETERIZATION N/A 08/03/2016   Procedure: Left Heart Cath and Coronary Angiography;  Surgeon: Lyn RecordsHenry W Smith, MD;  [facility];  Service: Cardiovascular;  Laterality: N/A;  . EXTERNAL EAR SURGERY    . TONSILLECTOMY    . tubes in ears       No Known Allergies    Family History  Problem Relation Age of Onset  . Cancer Father   . Tuberculosis Father      Social History Mr. Cresenciano Genreruitt reports that he quit smoking about 2 months ago. His smoking use included Cigarettes and Cigars. He has never used smokeless tobacco. Mr. Cresenciano Genreruitt reports that he does not drink alcohol.   Review of Systems CONSTITUTIONAL: No weight loss, fever, chills, weakness or fatigue.  HEENT: Eyes: No visual loss, blurred vision, double vision or yellow sclerae.No hearing loss, sneezing, congestion, runny nose or sore throat.  SKIN: No rash or itching.  CARDIOVASCULAR:  RESPIRATORY: No shortness of breath, cough or sputum.  GASTROINTESTINAL: No  anorexia, nausea, vomiting or diarrhea. No abdominal pain or blood.  GENITOURINARY: No burning on urination, no polyuria NEUROLOGICAL: No headache, dizziness, syncope, paralysis, ataxia, numbness or tingling in the extremities. No change in bowel or bladder control.  MUSCULOSKELETAL: No muscle, back pain, joint pain or stiffness.  LYMPHATICS: No enlarged nodes. No history of splenectomy.  PSYCHIATRIC: No history of depression or anxiety.  ENDOCRINOLOGIC: No reports of sweating, cold or heat intolerance. No polyuria or polydipsia.  Marland Kitchen.   Physical Examination There were no vitals filed for this visit. There were no vitals filed for this visit.  Gen: resting comfortably, no acute distress HEENT: no scleral icterus, pupils equal round and reactive, no palptable cervical adenopathy,  CV Resp: Clear to auscultation bilaterally GI: abdomen is soft, non-tender, non-distended, normal bowel sounds, no hepatosplenomegaly MSK: extremities are warm, no edema.  Skin: warm, no rash Neuro:  no focal deficits Psych: appropriate affect   Diagnostic Studies     Assessment and Plan        Antoine PocheJonathan F. Branch, M.D., F.A.C.C.

## 2016-11-07 ENCOUNTER — Ambulatory Visit: Payer: BLUE CROSS/BLUE SHIELD | Admitting: Cardiology

## 2017-06-09 ENCOUNTER — Emergency Department (HOSPITAL_COMMUNITY)
Admission: EM | Admit: 2017-06-09 | Discharge: 2017-06-09 | Disposition: A | Discharge status: Home / Self Care | Payer: BLUE CROSS/BLUE SHIELD | Attending: Emergency Medicine | Admitting: Emergency Medicine

## 2017-06-09 ENCOUNTER — Encounter (HOSPITAL_COMMUNITY): Payer: Self-pay | Admitting: Emergency Medicine

## 2017-06-09 DIAGNOSIS — R109 Unspecified abdominal pain: Secondary | ICD-10-CM | POA: Diagnosis present

## 2017-06-09 DIAGNOSIS — R112 Nausea with vomiting, unspecified: Secondary | ICD-10-CM

## 2017-06-09 DIAGNOSIS — R1084 Generalized abdominal pain: Secondary | ICD-10-CM | POA: Diagnosis not present

## 2017-06-09 DIAGNOSIS — Z87891 Personal history of nicotine dependence: Secondary | ICD-10-CM | POA: Insufficient documentation

## 2017-06-09 LAB — COMPREHENSIVE METABOLIC PANEL
ALK PHOS: 59 U/L (ref 38–126)
ALT: 10 U/L — ABNORMAL LOW (ref 17–63)
ANION GAP: 8 (ref 5–15)
AST: 16 U/L (ref 15–41)
Albumin: 4.6 g/dL (ref 3.5–5.0)
BILIRUBIN TOTAL: 0.6 mg/dL (ref 0.3–1.2)
BUN: 14 mg/dL (ref 6–20)
CHLORIDE: 103 mmol/L (ref 101–111)
CO2: 28 mmol/L (ref 22–32)
Calcium: 9.4 mg/dL (ref 8.9–10.3)
Creatinine, Ser: 1.04 mg/dL (ref 0.61–1.24)
Glucose, Bld: 113 mg/dL — ABNORMAL HIGH (ref 65–99)
Potassium: 4.1 mmol/L (ref 3.5–5.1)
Sodium: 139 mmol/L (ref 135–145)
Total Protein: 7.8 g/dL (ref 6.5–8.1)

## 2017-06-09 LAB — URINALYSIS, ROUTINE W REFLEX MICROSCOPIC
BILIRUBIN URINE: NEGATIVE
GLUCOSE, UA: NEGATIVE mg/dL
KETONES UR: NEGATIVE mg/dL
LEUKOCYTES UA: NEGATIVE
Nitrite: NEGATIVE
PH: 5 (ref 5.0–8.0)
Protein, ur: NEGATIVE mg/dL
SPECIFIC GRAVITY, URINE: 1.023 (ref 1.005–1.030)

## 2017-06-09 LAB — CBC
HCT: 43.8 % (ref 39.0–52.0)
HEMOGLOBIN: 15.1 g/dL (ref 13.0–17.0)
MCH: 31 pg (ref 26.0–34.0)
MCHC: 34.5 g/dL (ref 30.0–36.0)
MCV: 89.9 fL (ref 78.0–100.0)
Platelets: 239 10*3/uL (ref 150–400)
RBC: 4.87 MIL/uL (ref 4.22–5.81)
RDW: 12.2 % (ref 11.5–15.5)
WBC: 9.3 10*3/uL (ref 4.0–10.5)

## 2017-06-09 LAB — LIPASE, BLOOD: Lipase: 22 U/L (ref 11–51)

## 2017-06-09 MED ORDER — ONDANSETRON 8 MG PO TBDP: 8.0000 mg | ORAL_TABLET | Freq: Three times a day (TID) | ORAL | 0 refills | Status: DC | PRN

## 2017-06-09 MED ORDER — ONDANSETRON HCL 4 MG/2ML IJ SOLN
4.0000 mg | Freq: Once | INTRAMUSCULAR | Status: AC
  Administered 2017-06-09: 4 mg via INTRAVENOUS
  Filled 2017-06-09: qty 2

## 2017-06-09 MED ORDER — IBUPROFEN 600 MG PO TABS: 600.0000 mg | ORAL_TABLET | Freq: Three times a day (TID) | ORAL | 0 refills | Status: DC | PRN

## 2017-06-09 MED ORDER — KETOROLAC TROMETHAMINE 30 MG/ML IJ SOLN
30.0000 mg | Freq: Once | INTRAMUSCULAR | Status: AC
Start: 2017-06-09 — End: 2017-06-09
  Administered 2017-06-09: 30 mg via INTRAVENOUS
  Filled 2017-06-09: qty 1

## 2017-06-09 NOTE — ED Provider Notes (Signed)
[facility] EMERGENCY DEPARTMENT Provider Note   CSN: [id number] Arrival date & time: 06/09/17  0945     History   Chief Complaint Chief Complaint  Patient presents with  . Abdominal Pain    HPI Derrick Woods is a 26 y.o. male.  HPI Patient is a 26 year old male presents the emergency department with mid abdominal discomfort which is sharp.  He had associated nausea vomiting today.  No diarrhea.  No urinary complaints.  Denies fevers and chills.  No blood in his vomit.  Patient had similar symptoms transiently 5 days ago that resolved after Pepto-Bismol.  He was asymptomatic between those times.  No new medications.  No recent sick contacts.  Symptoms are mild in severity.    Past Medical History:  Diagnosis Date  . Acute myopericarditis    a. 07/2016 Presentation w/ ST elevation and + trop/Cath: Nl cors, EF 45-55%, mildly elevated LVEDP;  b. 07/2016 Echo: EF 60-65%, no rwma;  08/05/16 f/u echo: Ef 55-60%.  . Tobacco abuse     Patient Active Problem List   Diagnosis Date Noted  . Elevated troponin 08/05/2016  . Pericarditis 08/03/2016  . Tobacco use disorder 08/03/2016  . Acute coronary syndrome (HCC) 08/03/2016  . Other chest pain   . Acute myopericarditis     Past Surgical History:  Procedure Laterality Date  . CARDIAC CATHETERIZATION N/A 08/03/2016   Procedure: Left Heart Cath and Coronary Angiography;  Surgeon: Lyn Records, MD;  Location: [facility] INVASIVE CV LAB;  Service: Cardiovascular;  Laterality: N/A;  . EXTERNAL EAR SURGERY    . TONSILLECTOMY    . tubes in ears         Home Medications    Prior to Admission medications   Medication Sig Start Date End Date Taking? Authorizing Provider  ondansetron (ZOFRAN ODT) 8 MG disintegrating tablet Take 1 tablet (8 mg total) by mouth every 8 (eight) hours as needed for nausea or vomiting. 06/09/17   Azalia Bilis, MD    Family History Family History  Problem Relation Age of Onset  . Cancer Father   .  Tuberculosis Father     Social History Social History  Substance Use Topics  . Smoking status: Former Smoker    Types: Cigarettes, Cigars    Quit date: 08/03/2016  . Smokeless tobacco: Never Used  . Alcohol use No     Allergies   Patient has no known allergies.   Review of Systems Review of Systems  All other systems reviewed and are negative.    Physical Exam Updated Vital Signs BP 115/82 (BP Location: Left Arm)   Pulse 60   Temp 97.6 F (36.4 C) (Oral)   Resp 18   Ht 5\' 10"  (1.778 m)   Wt 79.4 kg (175 lb)   SpO2 99%   BMI 25.11 kg/m   Physical Exam  Constitutional: He is oriented to person, place, and time. He appears well-developed and well-nourished.  HENT:  Head: Normocephalic.  Eyes: EOM are normal.  Neck: Normal range of motion.  Pulmonary/Chest: Effort normal.  Abdominal: He exhibits no distension. There is no tenderness.  Musculoskeletal: Normal range of motion.  Neurological: He is alert and oriented to person, place, and time.  Psychiatric: He has a normal mood and affect.  Nursing note and vitals reviewed.    ED Treatments / Results  Labs (all labs ordered are listed, but only abnormal results are displayed) Labs Reviewed  COMPREHENSIVE METABOLIC PANEL - Abnormal; Notable for the  following:       Result Value   Glucose, Bld 113 (*)    ALT 10 (*)    All other components within normal limits  LIPASE, BLOOD  CBC  URINALYSIS, ROUTINE W REFLEX MICROSCOPIC    EKG  EKG Interpretation None       Radiology No results found.  Procedures Procedures (including critical care time)  Medications Ordered in ED Medications  ketorolac (TORADOL) 30 MG/ML injection 30 mg (30 mg Intravenous Given 06/09/17 1104)  ondansetron (ZOFRAN) injection 4 mg (4 mg Intravenous Given 06/09/17 1131)     Initial Impression / Assessment and Plan / ED Course  I have reviewed the triage vital signs and the nursing notes.  Pertinent labs & imaging results  that were available during my care of the patient were reviewed by me and considered in my medical decision making (see chart for details).     No focal tenderness on examination.  Feels better after anti-inflammatories and antiemetics.  Home with Zofran.  Could represent very early appendicitis however his pain began today and I think we have time to evaluate this.  I have asked the patient return to the emergency department tomorrow for repeat abdominal exam if his symptoms persist.  I suspect this is more of a GI illness.  Home with Zofran.  Encouraged anti-inflammatories and fluids.  Patient understands he is welcome to return to the emergency department at anytime for new or worsening symptoms  Final Clinical Impressions(s) / ED Diagnoses   Final diagnoses:  Generalized abdominal pain  Nausea and vomiting, intractability of vomiting not specified, unspecified vomiting type    New Prescriptions New Prescriptions   ONDANSETRON (ZOFRAN ODT) 8 MG DISINTEGRATING TABLET    Take 1 tablet (8 mg total) by mouth every 8 (eight) hours as needed for nausea or vomiting.     Azalia Bilisampos, Hykeem Ojeda, MD 06/09/17 1149

## 2017-06-09 NOTE — ED Triage Notes (Signed)
Patient c/o mid abd pain that started on Tuesday but was relieved after taking pepto-bismol and returned this morning. Denies any fevers or diarrhea. Per patient nausea and vomiting today. Denies taking any medications today. Last normal BM was yesterday. No blood noted.

## 2017-06-09 NOTE — ED Notes (Signed)
Pt informed of need for urine sample, states he cannot provide one at this time. Pt informed to let nursing staff know when able to do so.

## 2017-11-07 IMAGING — CR DG CHEST 1V PORT
1 series · 1 of 1 positions shown · non-contrast
Comparison: None.

CLINICAL DATA: Chest pain for 1 our

EXAM:
PORTABLE CHEST 1 VIEW

[portable]
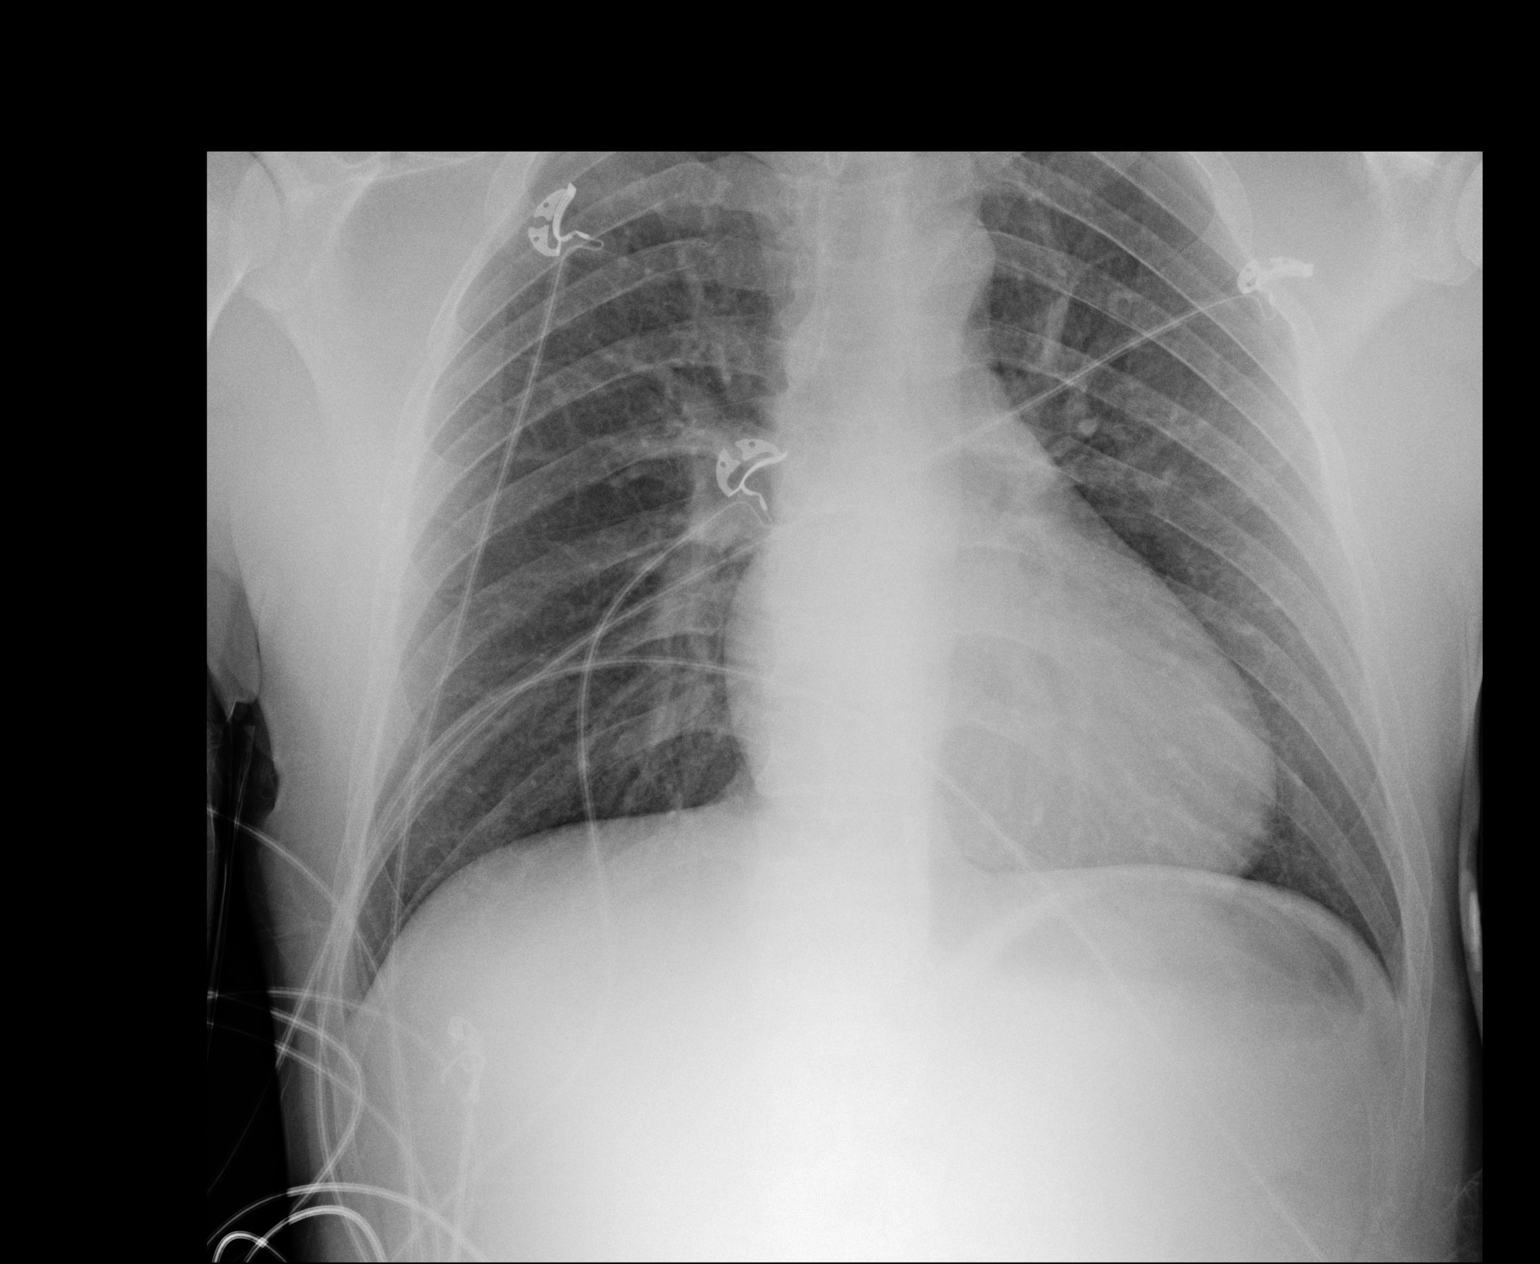

[1 of 1 positions shown; findings below may reference images not displayed]

FINDINGS: The heart size and mediastinal contours are within normal limits.
Both lungs are clear. The visualized skeletal structures are
unremarkable.
IMPRESSION: No active disease.

## 2018-02-26 NOTE — Progress Notes (Deleted)
Cardiology Office Note    Date:  02/26/2018   ID:  Derrick Woods, DOB 08/18/1991, MRN [id number]  PCP:  [facility]  Cardiologist: No primary care provider on file.  No chief complaint on file.   History of Present Illness:  Derrick Woods is a 27 y.o. male with history of myopericarditis, cardiac catheterization 07/2016 revealing normal coronary arteries, history of tobacco abuse.  In our office by Joni Reining, NP 09/17/2016    Past Medical History:  Diagnosis Date  . Acute myopericarditis    a. 07/2016 Presentation w/ ST elevation and + trop/Cath: Nl cors, EF 45-55%, mildly elevated LVEDP;  b. 07/2016 Echo: EF 60-65%, no rwma;  08/05/16 f/u echo: Ef 55-60%.  . Tobacco abuse     Past Surgical History:  Procedure Laterality Date  . CARDIAC CATHETERIZATION N/A 08/03/2016   Procedure: Left Heart Cath and Coronary Angiography;  Surgeon: Lyn Records, MD;  Location: [facility] INVASIVE CV LAB;  Service: Cardiovascular;  Laterality: N/A;  . EXTERNAL EAR SURGERY    . TONSILLECTOMY    . tubes in ears      Current Medications: No outpatient medications have been marked as taking for the 03/10/18 encounter (Appointment) with Dyann Kief, PA-C.     Allergies:   Patient has no known allergies.   Social History   Socioeconomic History  . Marital status: Single    Spouse name: Not on file  . Number of children: Not on file  . Years of education: Not on file  . Highest education level: Not on file  Occupational History  . Not on file  Social Needs  . Financial resource strain: Not on file  . Food insecurity:    Worry: Not on file    Inability: Not on file  . Transportation needs:    Medical: Not on file    Non-medical: Not on file  Tobacco Use  . Smoking status: Former Smoker    Types: Cigarettes, Cigars    Last attempt to quit: 08/03/2016    Years since quitting: 1.5  . Smokeless tobacco: Never Used  Substance and Sexual Activity  .  Alcohol use: No  . Drug use: Yes    Types: Marijuana  . Sexual activity: Not on file  Lifestyle  . Physical activity:    Days per week: Not on file    Minutes per session: Not on file  . Stress: Not on file  Relationships  . Social connections:    Talks on phone: Not on file    Gets together: Not on file    Attends religious service: Not on file    Active member of club or organization: Not on file    Attends meetings of clubs or organizations: Not on file    Relationship status: Not on file  Other Topics Concern  . Not on file  Social History Narrative  . Not on file     Family History:  The patient's ***family history includes Cancer in his father; Tuberculosis in his father.   ROS:   Please see the history of present illness.    ROS All other systems reviewed and are negative.   PHYSICAL EXAM:   VS:  There were no vitals taken for this visit.  Physical Exam  GEN: Well nourished, well developed, in no acute distress  HEENT: normal  Neck: no JVD, carotid bruits, or masses Cardiac:RRR; no murmurs, rubs, or gallops  Respiratory:  clear to auscultation  bilaterally, normal work of breathing GI: soft, nontender, nondistended, + BS Ext: without cyanosis, clubbing, or edema, Good distal pulses bilaterally MS: no deformity or atrophy  Skin: warm and dry, no rash Neuro:  Alert and Oriented x 3, Strength and sensation are intact Psych: euthymic mood, full affect  Wt Readings from Last 3 Encounters:  06/09/17 175 lb (79.4 kg)  09/17/16 171 lb (77.6 kg)  08/29/16 155 lb (70.3 kg)      Studies/Labs Reviewed:   EKG:  EKG is*** ordered today.  The ekg ordered today demonstrates ***  Recent Labs: 06/09/2017: ALT 10; BUN 14; Creatinine, Ser 1.04; Hemoglobin 15.1; Platelets 239; Potassium 4.1; Sodium 139   Lipid Panel No results found for: CHOL, TRIG, HDL, CHOLHDL, VLDL, LDLCALC, LDLDIRECT  Additional studies/ records that were reviewed today include:   Echocardiogram  08/05/2016   - Left ventricle: Systolic function was normal. The estimated   ejection fraction was in the range of 55% to 60%. Left   ventricular diastolic function parameters were normal. - Atrial septum: No defect or patent foramen ovale was identified.   Cardiac Catheterization 12.15.2017   Conclusion   Normal coronary arteries.  Anteroapical, apical, and inferoapical mild hypokinesis. Estimated ejection fraction 45-55%. Mildly elevated LVEDP.  Presenting EKG, elevated troponin I, and wall motion abnormality are consistent with myopericarditis with regional wall motion presentation.    Left Heart  Left Ventricle There is mild left ventricular systolic dysfunction. LV end diastolic pressure is mildly elevated. The left ventricular ejection fraction is 45-         ASSESSMENT:    No diagnosis found.   PLAN:  In order of problems listed above:  History of myopericarditis in 2017 treated with colchicine cardiac cath at that time showed normal coronary arteries  Tobacco abuse  Medication Adjustments/Labs and Tests Ordered: Current medicines are reviewed at length with the patient today.  Concerns regarding medicines are outlined above.  Medication changes, Labs and Tests ordered today are listed in the Patient Instructions below. There are no Patient Instructions on file for this visit.   Elson ClanSigned, Michele Lenze, PA-C  02/26/2018 3:07 PM    [facility] [address], [place], [place]  [id number] Phone: [phone]; Fax: [phone]

## 2018-03-10 ENCOUNTER — Ambulatory Visit: Payer: BLUE CROSS/BLUE SHIELD | Admitting: Physician Assistant

## 2018-03-28 ENCOUNTER — Ambulatory Visit (INDEPENDENT_AMBULATORY_CARE_PROVIDER_SITE_OTHER): Payer: BLUE CROSS/BLUE SHIELD | Admitting: Student

## 2018-03-28 ENCOUNTER — Encounter: Payer: Self-pay | Admitting: Student

## 2018-03-28 VITALS — BP 124/80 | HR 68 | Ht 70.0 in | Wt 190.0 lb

## 2018-03-28 DIAGNOSIS — I319 Disease of pericardium, unspecified: Secondary | ICD-10-CM

## 2018-03-28 DIAGNOSIS — R002 Palpitations: Secondary | ICD-10-CM | POA: Diagnosis not present

## 2018-03-28 DIAGNOSIS — Z720 Tobacco use: Secondary | ICD-10-CM

## 2018-03-28 NOTE — Patient Instructions (Signed)
Your physician recommends that you schedule a follow-up appointment in: as needed with Dr.Branch      Your physician recommends that you continue on your current medications as directed. Please refer to the Current Medication list given to you today.      No tests today      Thank you for choosing [facility] !

## 2018-03-28 NOTE — Progress Notes (Signed)
Cardiology Office Note    Date:  03/28/2018   ID:  Derrick Woods, DOB 19-Jun-1991, MRN [id number]  [facility]  Cardiologist: Dina RichBranch, Jonathan, MD    Chief Complaint  Patient presents with  . Follow-up    Annual Visit    History of Present Illness:    Derrick Woods is a 27 y.o. male with past medical history of acute myopericarditis (diagnosed in 07/2016 with cath performed due to ST elevation and showing normal cors) and tobacco use who presents to the office today for overdue annual follow-up.  He was last examined by Joni ReiningKathryn Lawrence, DNP in 08/2016 and denied any recurrent chest pain or dyspnea on exertion. He did report having some swelling along his feet which was typically worse at the end of the day. This was thought to possibly be secondary to Colchicine and it was recommended to discontinue this at his follow-up appointment in 10/2016 if he remained asymptomatic from a cardiac perspective. He has not been evaluated by Cardiology since.  In talking with the patient today, he reports overall doing well from a cardiac perspective since his last office visit.  He denies any recent episodes of chest pain or dyspnea on exertion. 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 Does notice intermittent edema along his legs following his work shifts but says this has resolved by the following morning. No recent orthopnea or PND.  He reports that a few months ago he did feel like his heart was "skipping beats" but denies any repeat episodes since. Says this only lasted for a few seconds and he did not experience any associated lightheadedness, dizziness, or presyncope. He does consume between 8 to 10 cans of soda per day. Denies any alcohol use.  Continues to smoke 4 to 5 cigars per day.   Past Medical History:  Diagnosis Date  . Acute myopericarditis    a. 07/2016 Presentation w/ ST elevation and +  trop/Cath: Nl cors, EF 45-55%, mildly elevated LVEDP;  b. 07/2016 Echo: EF 60-65%, no rwma;  08/05/16 f/u echo: Ef 55-60%.  . Tobacco abuse     Past Surgical History:  Procedure Laterality Date  . CARDIAC CATHETERIZATION N/A 08/03/2016   Procedure: Left Heart Cath and Coronary Angiography;  Surgeon: Lyn RecordsHenry W Smith, MD;  [facility]: [facility] INVASIVE [facility] LAB;  Service: Cardiovascular;  Laterality: N/A;  . EXTERNAL EAR SURGERY    . TONSILLECTOMY    . tubes in ears      Current Medications: Outpatient Medications Prior to Visit  Medication Sig Dispense Refill  . ibuprofen (ADVIL,MOTRIN) 600 MG tablet Take 1 tablet (600 mg total) by mouth every 8 (eight) hours as needed. 15 tablet 0  . ondansetron (ZOFRAN ODT) 8 MG disintegrating tablet Take 1 tablet (8 mg total) by mouth every 8 (eight) hours as needed for nausea or vomiting. 10 tablet 0   No facility-administered medications prior to visit.      Allergies:   Patient has no known allergies.   Social History   Socioeconomic History  . Marital status: Single    Spouse name: Not on file  . Number of children: Not on file  . Years of education: Not on file  . Highest education level: Not on file  Occupational History  . Not on file  Social Needs  . Financial resource strain: Not on file  . Food insecurity:  Worry: Not on file    Inability: Not on file  . Transportation needs:    Medical: Not on file    Non-medical: Not on file  Tobacco Use  . Smoking status: Current Every Day Smoker    Types: Cigarettes, Cigars    Last attempt to quit: 08/03/2016    Years since quitting: 1.6  . Smokeless tobacco: Never Used  Substance and Sexual Activity  . Alcohol use: No  . Drug use: Yes    Types: Marijuana  . Sexual activity: Not on file  Lifestyle  . Physical activity:    Days per week: Not on file    Minutes per session: Not on file  . Stress: Not on file  Relationships  . Social connections:    Talks on phone: Not on file    Gets  together: Not on file    Attends religious service: Not on file    Active member of club or organization: Not on file    Attends meetings of clubs or organizations: Not on file    Relationship status: Not on file  Other Topics Concern  . Not on file  Social History Narrative  . Not on file     Family History:  The patient's family history includes Cancer in his father; Tuberculosis in his father.   Review of Systems:   Please see the history of present illness.     General:  No chills, fever, night sweats or weight changes.  Cardiovascular:  No chest pain, dyspnea on exertion, orthopnea, paroxysmal nocturnal dyspnea. Positive for palpitations and intermittent edema.  Dermatological: No rash, lesions/masses Respiratory: No cough, dyspnea Urologic: No hematuria, dysuria Abdominal:   No nausea, vomiting, diarrhea, bright red blood per rectum, melena, or hematemesis Neurologic:  No visual changes, wkns, changes in mental status. All other systems reviewed and are otherwise negative except as noted above.   Physical Exam:    VS:  BP 124/80   Pulse 68   Ht 5\' 10"  (1.778 m)   Wt 190 lb (86.2 kg)   SpO2 98% Comment: on room air  BMI 27.26 kg/m    General: Well developed, well nourished Caucasian male appearing in no acute distress. Head: Normocephalic, atraumatic, sclera non-icteric, no xanthomas, nares are without discharge.  Neck: No carotid bruits. JVD not elevated.  Lungs: Respirations regular and unlabored, without wheezes or rales.  Heart: Regular rate and rhythm. No S3 or S4.  No murmur, no rubs, or gallops appreciated. Abdomen: Soft, non-tender, non-distended with normoactive bowel sounds. No hepatomegaly. No rebound/guarding. No obvious abdominal masses. Msk:  Strength and tone appear normal for age. No joint deformities or effusions. Extremities: No clubbing or cyanosis. No lower extremity edema.  Distal pedal pulses are 2+ bilaterally. Neuro: Alert and oriented X 3.  Moves all extremities spontaneously. No focal deficits noted. Psych:  Responds to questions appropriately with a normal affect. Skin: No rashes or lesions noted  Wt Readings from Last 3 Encounters:  03/28/18 190 lb (86.2 kg)  06/09/17 175 lb (79.4 kg)  09/17/16 171 lb (77.6 kg)     Studies/Labs Reviewed:   EKG:  EKG is ordered today. The ekg ordered today demonstrates normal sinus rhythm, heart rate 65, with RAD. No acute ST or T-wave changes when compared to prior tracings.  Recent Labs: 06/09/2017: ALT 10; BUN 14; Creatinine, Ser 1.04; Hemoglobin 15.1; Platelets 239; Potassium 4.1; Sodium 139   Lipid Panel No results found for: CHOL, TRIG, HDL, CHOLHDL, VLDL, LDLCALC,  LDLDIRECT  Additional studies/ records that were reviewed today include:   Cardiac Catheterization: 07/2016  Normal coronary arteries.  Anteroapical, apical, and inferoapical mild hypokinesis. Estimated ejection fraction 45-55%. Mildly elevated LVEDP.  Presenting EKG, elevated troponin I, and wall motion abnormality are consistent with myopericarditis with regional wall motion presentation.  RECOMMENDATIONS:   Observation for progression of myocardial depression/injury and ventricular arrhythmia.  Given wall motion abnormality the patient should probably be observed through the weekend despite the fact that chest discomfort has improved.  Recommend repeat echocardiogram prior to discharge.  BNP. Serial cardiac biomarkers.  Limited Echo: 07/2016 Study Conclusions  - Left ventricle: Systolic function was normal. The estimated   ejection fraction was in the range of 55% to 60%. Left   ventricular diastolic function parameters were normal. - Atrial septum: No defect or patent foramen ovale was identified.  Assessment:    1. Myopericarditis   2. Palpitations   3. Tobacco abuse      Plan:   In order of problems listed above:  1. History of Myopericarditis - Diagnosed in 07/2016 and a cardiac  catheterization was performed at that time due to persistent chest pain and ST elevation on his EKG. Catheterization showed normal coronary arteries. He remained on NSAID's for 14 days and completed a 63-month course of Colchicine. He denies any recent chest pain or dyspnea on exertion.  Reports being active at baseline and denies any recurrent symptoms. - No indication for further testing at this time.  He wishes to follow-up with Cardiology on an as-needed basis.  2. Palpitations - Reports an episode of palpitations a few months ago which only lasted for a few seconds and spontaneously resolved. Denies any associated chest pain, lightheadedness, dizziness, or presyncope. He denies any alcohol use but does consume between 8 to 10 cans of regular soda per day. I imagine his symptoms were likely PAC's or PVC's which were triggered by significant caffeine intake. Recommended reducing his caffeine intake to 2 sodas or less per day. Given no recurrent symptoms recently, would not plan for further monitoring at this time.  I advised the patient that if he has recurrent or more frequent symptoms, to make [place] aware as we could consider a monitor at that time.  3. Tobacco Use - Continues to smoke 4 to 5 cigars per day. Cessation was advised.    Medication Adjustments/Labs and Tests Ordered: Current medicines are reviewed at length with the patient today.  Concerns regarding medicines are outlined above.  Medication changes, Labs and Tests ordered today are listed in the Patient Instructions below. Patient Instructions  Your physician recommends that you schedule a follow-up appointment in: as needed with Dr.Branch  Your physician recommends that you continue on your current medications as directed. Please refer to the Current Medication list given to you today.  No tests today  Thank you for choosing [facility] !       Signed, Ellsworth Lennox, PA-C  03/28/2018 4:21 PM      [facility] [address] [place], [place] [address] Phone: [phone]

## 2018-09-23 DIAGNOSIS — Z113 Encounter for screening for infections with a predominantly sexual mode of transmission: Secondary | ICD-10-CM | POA: Diagnosis not present

## 2018-09-23 DIAGNOSIS — E663 Overweight: Secondary | ICD-10-CM | POA: Diagnosis not present

## 2018-09-23 DIAGNOSIS — Z0000 Encounter for general adult medical examination without abnormal findings: Secondary | ICD-10-CM | POA: Diagnosis not present

## 2018-09-23 DIAGNOSIS — Z6829 Body mass index (BMI) 29.0-29.9, adult: Secondary | ICD-10-CM | POA: Diagnosis not present

## 2018-09-23 DIAGNOSIS — Z1389 Encounter for screening for other disorder: Secondary | ICD-10-CM | POA: Diagnosis not present

## 2019-09-03 ENCOUNTER — Other Ambulatory Visit: Payer: BLUE CROSS/BLUE SHIELD

## 2023-02-26 ENCOUNTER — Encounter: Payer: Self-pay | Admitting: Internal Medicine

## 2023-02-26 ENCOUNTER — Telehealth: Payer: Self-pay

## 2023-02-26 ENCOUNTER — Ambulatory Visit: Payer: BC Managed Care – PPO | Attending: Internal Medicine | Admitting: Internal Medicine

## 2023-02-26 VITALS — BP 116/70 | HR 94 | Ht 68.0 in | Wt 200.4 lb

## 2023-02-26 DIAGNOSIS — Z8249 Family history of ischemic heart disease and other diseases of the circulatory system: Secondary | ICD-10-CM

## 2023-02-26 DIAGNOSIS — Z136 Encounter for screening for cardiovascular disorders: Secondary | ICD-10-CM | POA: Diagnosis not present

## 2023-02-26 NOTE — Patient Instructions (Addendum)
Medication Instructions:  Your physician recommends that you continue on your current medications as directed. Please refer to the Current Medication list given to you today.   Labwork: Lipoprotein-A done today at [facility]  Testing/Procedures: None  Follow-Up: Your physician recommends that you schedule a follow-up appointment in: Pending Results  Any Other Special Instructions Will Be Listed Below (If Applicable).  If you need a refill on your cardiac medications before your next appointment, please call your pharmacy.

## 2023-02-26 NOTE — Progress Notes (Signed)
Cardiology Office Note  Date: 02/26/2023   ID: Derrick Woods, DOB 05-25-1991, MRN [id number]  PCP:  Billie Lade, MD  Cardiologist:  Dina Rich, MD Electrophysiologist:  None   Reason for Office Visit: To reestablish care   History of Present Illness: Derrick Woods is a 32 y.o. male known to have history of myopericarditis in 2017 with improvement in LVEF from 45 to 50% to 55 to 60% in a few days, nicotine abuse was referred to cardiology clinic to reestablish care.  No interval angina or DOE.  He had 1 episode of sharp chest pain lasting for a few seconds/minute when he was riding at work.  He is physically active at baseline and denies any chest pain with exertion.  No ER visit or hospitalizations.  Smokes 2 cigars/day.  He has a family history manage CAD, his mother had PCI in her late 9s.  Past Medical History:  Diagnosis Date   Acute myopericarditis    a. 07/2016 Presentation w/ ST elevation and + trop/Cath: Nl cors, EF 45-55%, mildly elevated LVEDP;  b. 07/2016 Echo: EF 60-65%, no rwma;  08/05/16 f/u echo: Ef 55-60%.   Tobacco abuse     Past Surgical History:  Procedure Laterality Date   CARDIAC CATHETERIZATION N/A 08/03/2016   Procedure: Left Heart Cath and Coronary Angiography;  Surgeon: Lyn Records, MD;  Location: [facility] INVASIVE CV LAB;  Service: Cardiovascular;  Laterality: N/A;   EXTERNAL EAR SURGERY     TONSILLECTOMY     tubes in ears      No current outpatient medications on file.   No current facility-administered medications for this visit.   Allergies:  Patient has no known allergies.   Social History: The patient  reports that he has been smoking cigarettes and cigars. He has never used smokeless tobacco. He reports current drug use. Drug: Marijuana. He reports that he does not drink alcohol.   Family History: The patient's family history includes Cancer in his father; Tuberculosis in his father.   ROS:  Please see the history of present  illness. Otherwise, complete review of systems is positive for none  All other systems are reviewed and negative.   Physical Exam: VS:  BP 116/70   Pulse 94   Ht 5\' 8"  (1.727 m)   Wt 200 lb 6.4 oz (90.9 kg)   SpO2 95%   BMI 30.47 kg/m , BMI Body mass index is 30.47 kg/m.  Wt Readings from Last 3 Encounters:  02/26/23 200 lb 6.4 oz (90.9 kg)  03/28/18 190 lb (86.2 kg)  06/09/17 175 lb (79.4 kg)    General: Patient appears comfortable at rest. HEENT: Conjunctiva and lids normal, oropharynx clear with moist mucosa. Neck: Supple, no elevated JVP or carotid bruits, no thyromegaly. Lungs: Clear to auscultation, nonlabored breathing at rest. Cardiac: Regular rate and rhythm, no S3 or significant systolic murmur, no pericardial rub. Abdomen: Soft, nontender, no hepatomegaly, bowel sounds present, no guarding or rebound. Extremities: No pitting edema, distal pulses 2+. Skin: Warm and dry. Musculoskeletal: No kyphosis. Neuropsychiatric: Alert and oriented x3, affect grossly appropriate.  Recent Labwork: No results found for requested labs within last 365 days.  No results found for: "CHOL", "TRIG", "HDL", "CHOLHDL", "VLDL", "LDLCALC", "LDLDIRECT"  Other Studies Reviewed Today:   Assessment and Plan:  # Family history of premature CAD (Mother had PCI in her late 37s) -No angina or DOE. Physically active at baseline, more than 4 METS. Obtain lipid panel and  lipoprotein a levels.  # History of myopericarditis in 2017 -LVEF was 45 to 50% that recovered to 55 to 60% in a few days.  No recurrence since then.  # Nicotine abuse -Patient smokes 2 cigars/day.  Smoking cessation counseling provided. Smoking cessation instruction/counseling given:  counseled patient on the dangers of tobacco use, advised patient to stop smoking, and reviewed strategies to maximize success    I have spent a total of 45 minutes with patient reviewing chart, EKGs, labs and examining patient as well as  establishing an assessment and plan that was discussed with the patient.  > 50% of time was spent in direct patient care.    Medication Adjustments/Labs and Tests Ordered: Current medicines are reviewed at length with the patient today.  Concerns regarding medicines are outlined above.   Tests Ordered: Orders Placed This Encounter  Procedures   Lipoprotein A (LPA)   EKG 12-Lead    Medication Changes: No orders of the defined types were placed in this encounter.   Disposition:  Follow up  pending results  Signed Usman Millett Verne Spurr, MD, 02/26/2023 11:38 AM    [facility] [address] [place], [place], [place] [address]

## 2023-02-26 NOTE — Telephone Encounter (Signed)
Called patient to let him know that additional lab order was placed for [facility] if he can have that obtained as well. Order has already been placed in chart. Left voicemail to call office as well.

## 2023-02-27 ENCOUNTER — Telehealth: Payer: Self-pay | Admitting: Internal Medicine

## 2023-02-27 LAB — LIPID PANEL
Chol/HDL Ratio: 6.6 ratio — ABNORMAL HIGH (ref 0.0–5.0)
Cholesterol, Total: 211 mg/dL — ABNORMAL HIGH (ref 100–199)
HDL: 32 mg/dL — ABNORMAL LOW (ref >39)
LDL Chol Calc (NIH): 151 mg/dL — ABNORMAL HIGH (ref 0–99)
Triglycerides: 152 mg/dL — ABNORMAL HIGH (ref 0–149)
VLDL Cholesterol Cal: 28 mg/dL (ref 5–40)

## 2023-02-27 NOTE — Telephone Encounter (Signed)
Spoke to patient and advised him that the results have not been yet viewed by the provider.

## 2023-02-27 NOTE — Telephone Encounter (Signed)
Patient is requesting a call back to discuss lab results. 

## 2023-02-28 LAB — LIPOPROTEIN A (LPA): Lipoprotein (a): 194.9 nmol/L — ABNORMAL HIGH (ref <75.0)

## 2023-03-04 ENCOUNTER — Ambulatory Visit: Payer: Self-pay | Admitting: Internal Medicine

## 2023-03-06 ENCOUNTER — Telehealth: Payer: Self-pay

## 2023-03-06 MED ORDER — ROSUVASTATIN CALCIUM 20 MG PO TABS: 20.0000 mg | ORAL_TABLET | Freq: Every day | ORAL | 1 refills | Status: DC

## 2023-03-06 NOTE — Telephone Encounter (Signed)
Patient informed and sent in Rosuvastatin 20 mg. Sent PCP copy. Patient verbalized understanding.

## 2023-03-06 NOTE — Telephone Encounter (Signed)
Patient informed. 

## 2023-03-06 NOTE — Telephone Encounter (Signed)
-----   Message from Vishnu P Mallipeddi sent at 03/06/2023  9:30 AM EDT ----- Lipoprotein a levels are significantly elevated, 194.9 (normal should be less than 75).  Start high intensity statin, rosuvastatin 20 mg nightly.  Side effects include myalgias.  If any myalgias, take the medication every other day and reevaluate symptoms.

## 2023-03-06 NOTE — Telephone Encounter (Signed)
-----   Message from Vishnu P Mallipeddi sent at 03/06/2023  9:33 AM EDT ----- Lipid panel showed LDL 151, TG 152.  Mildly elevated.  Rosuvastatin 20 mg nightly is already started for elevated lipoprotein a levels.

## 2024-03-26 ENCOUNTER — Other Ambulatory Visit: Payer: Self-pay | Admitting: Internal Medicine
